# Patient Record
Sex: Female | Born: 1975 | Race: White | Hispanic: No | Marital: Single | Smoking: Never smoker
Health system: Southern US, Community
[De-identification: ages and names within clinical notes are randomized; demographics above are authoritative.]

## PROBLEM LIST (undated history)

## (undated) DIAGNOSIS — F419 Anxiety disorder, unspecified: Secondary | ICD-10-CM

## (undated) DIAGNOSIS — Z8781 Personal history of (healed) traumatic fracture: Secondary | ICD-10-CM

## (undated) DIAGNOSIS — S5291XA Unspecified fracture of right forearm, initial encounter for closed fracture: Secondary | ICD-10-CM

## (undated) DIAGNOSIS — F329 Major depressive disorder, single episode, unspecified: Secondary | ICD-10-CM

## (undated) DIAGNOSIS — S5292XA Unspecified fracture of left forearm, initial encounter for closed fracture: Secondary | ICD-10-CM

## (undated) DIAGNOSIS — F32A Depression, unspecified: Secondary | ICD-10-CM

## (undated) DIAGNOSIS — M419 Scoliosis, unspecified: Secondary | ICD-10-CM

## (undated) DIAGNOSIS — G8929 Other chronic pain: Secondary | ICD-10-CM

---

## 2011-07-07 ENCOUNTER — Emergency Department: Payer: Self-pay | Admitting: Internal Medicine

## 2011-07-07 LAB — DRUG SCREEN, URINE
Barbiturates, Ur Screen: NEGATIVE (ref ?–200)
Cannabinoid 50 Ng, Ur ~~LOC~~: POSITIVE (ref ?–50)
Opiate, Ur Screen: NEGATIVE (ref ?–300)
Phencyclidine (PCP) Ur S: NEGATIVE (ref ?–25)
Tricyclic, Ur Screen: NEGATIVE (ref ?–1000)

## 2011-07-07 LAB — TSH: Thyroid Stimulating Horm: 0.47 u[IU]/mL

## 2011-07-07 LAB — COMPREHENSIVE METABOLIC PANEL
Alkaline Phosphatase: 76 U/L (ref 50–136)
Anion Gap: 8 (ref 7–16)
BUN: 17 mg/dL (ref 7–18)
Chloride: 105 mmol/L (ref 98–107)
Co2: 24 mmol/L (ref 21–32)
Creatinine: 0.87 mg/dL (ref 0.60–1.30)
EGFR (African American): >60
EGFR (Non-African Amer.): >60
Osmolality: 275 (ref 275–301)
Potassium: 3.5 mmol/L (ref 3.5–5.1)
Total Protein: 7.4 g/dL (ref 6.4–8.2)

## 2011-07-07 LAB — CBC
MCH: 31.2 pg (ref 26.0–34.0)
MCHC: 33.8 g/dL (ref 32.0–36.0)
RDW: 12.8 % (ref 11.5–14.5)
WBC: 8.7 10*3/uL (ref 3.6–11.0)

## 2011-07-07 LAB — URINALYSIS, COMPLETE
Blood: NEGATIVE
Glucose,UR: NEGATIVE mg/dL (ref 0–75)
Leukocyte Esterase: NEGATIVE
Nitrite: NEGATIVE
Ph: 6 (ref 4.5–8.0)
Protein: 30
RBC,UR: 1 /HPF (ref 0–5)
Specific Gravity: 1.039 (ref 1.003–1.030)

## 2011-07-07 LAB — PREGNANCY, URINE: Pregnancy Test, Urine: NEGATIVE m[IU]/mL

## 2012-03-29 ENCOUNTER — Ambulatory Visit: Payer: Self-pay | Admitting: Family Medicine

## 2013-02-01 ENCOUNTER — Inpatient Hospital Stay: Payer: Self-pay | Admitting: Surgery

## 2013-02-01 LAB — COMPREHENSIVE METABOLIC PANEL
ALK PHOS: 69 U/L
Albumin: 3.8 g/dL (ref 3.4–5.0)
Anion Gap: 2 — ABNORMAL LOW (ref 7–16)
BUN: 11 mg/dL (ref 7–18)
Bilirubin,Total: 0.5 mg/dL (ref 0.2–1.0)
CALCIUM: 8.9 mg/dL (ref 8.5–10.1)
CO2: 29 mmol/L (ref 21–32)
Chloride: 104 mmol/L (ref 98–107)
Creatinine: 0.75 mg/dL (ref 0.60–1.30)
EGFR (African American): >60
EGFR (Non-African Amer.): >60
Glucose: 118 mg/dL — ABNORMAL HIGH (ref 65–99)
Osmolality: 271 (ref 275–301)
Potassium: 3.6 mmol/L (ref 3.5–5.1)
SGOT(AST): 30 U/L (ref 15–37)
SGPT (ALT): 37 U/L (ref 12–78)
Sodium: 135 mmol/L — ABNORMAL LOW (ref 136–145)
Total Protein: 7.4 g/dL (ref 6.4–8.2)

## 2013-02-01 LAB — CBC WITH DIFFERENTIAL/PLATELET
BASOS ABS: 0 10*3/uL (ref 0.0–0.1)
BASOS PCT: 0.4 %
EOS PCT: 0.4 %
Eosinophil #: 0.1 10*3/uL (ref 0.0–0.7)
HCT: 39.3 % (ref 35.0–47.0)
HGB: 13 g/dL (ref 12.0–16.0)
Lymphocyte #: 0.7 10*3/uL — ABNORMAL LOW (ref 1.0–3.6)
Lymphocyte %: 5.6 %
MCH: 30.8 pg (ref 26.0–34.0)
MCHC: 33 g/dL (ref 32.0–36.0)
MCV: 94 fL (ref 80–100)
Monocyte #: 0.4 x10 3/mm (ref 0.2–0.9)
Monocyte %: 2.8 %
Neutrophil #: 11.5 10*3/uL — ABNORMAL HIGH (ref 1.4–6.5)
Neutrophil %: 90.8 %
PLATELETS: 262 10*3/uL (ref 150–440)
RBC: 4.2 10*6/uL (ref 3.80–5.20)
RDW: 12.6 % (ref 11.5–14.5)
WBC: 12.7 10*3/uL — ABNORMAL HIGH (ref 3.6–11.0)

## 2013-02-01 LAB — URINALYSIS, COMPLETE
BILIRUBIN, UR: NEGATIVE
BLOOD: NEGATIVE
Bacteria: NONE SEEN
Glucose,UR: NEGATIVE mg/dL (ref 0–75)
LEUKOCYTE ESTERASE: NEGATIVE
Nitrite: NEGATIVE
Ph: 5 (ref 4.5–8.0)
Protein: NEGATIVE
SPECIFIC GRAVITY: 1.021 (ref 1.003–1.030)

## 2013-02-01 LAB — PREGNANCY, URINE: Pregnancy Test, Urine: NEGATIVE m[IU]/mL

## 2013-02-01 LAB — GC/CHLAMYDIA PROBE AMP

## 2013-02-01 LAB — WET PREP, GENITAL

## 2013-02-02 LAB — CBC WITH DIFFERENTIAL/PLATELET
BASOS PCT: 0.1 %
Basophil #: 0 10*3/uL (ref 0.0–0.1)
EOS ABS: 0 10*3/uL (ref 0.0–0.7)
Eosinophil %: 0 %
HCT: 32.4 % — ABNORMAL LOW (ref 35.0–47.0)
HGB: 11.1 g/dL — ABNORMAL LOW (ref 12.0–16.0)
Lymphocyte #: 0.3 10*3/uL — ABNORMAL LOW (ref 1.0–3.6)
Lymphocyte %: 3.3 %
MCH: 32.3 pg (ref 26.0–34.0)
MCHC: 34.2 g/dL (ref 32.0–36.0)
MCV: 94 fL (ref 80–100)
MONO ABS: 0.3 x10 3/mm (ref 0.2–0.9)
Monocyte %: 2.6 %
Neutrophil #: 9.9 10*3/uL — ABNORMAL HIGH (ref 1.4–6.5)
Neutrophil %: 94 %
Platelet: 207 10*3/uL (ref 150–440)
RBC: 3.43 10*6/uL — AB (ref 3.80–5.20)
RDW: 12.6 % (ref 11.5–14.5)
WBC: 10.5 10*3/uL (ref 3.6–11.0)

## 2013-02-05 LAB — PATHOLOGY REPORT
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
## 2014-05-03 NOTE — Op Note (Signed)
PATIENT NAME:  Alexis Deleon, Alexis Deleon MR#:  [id number] DATE OF BIRTH:  04-05-1975  DATE OF PROCEDURE:  02/01/2013  DATE OF DICTATION: 02/02/2013   PREOPERATIVE DIAGNOSIS: Acute appendicitis.   POSTOPERATIVE DIAGNOSIS: Acute perforated appendicitis.   PROCEDURE PERFORMED: Laparoscopic appendectomy.   SURGEON: Wanita Derenzo A. Egbert GaribaldiBird, MD  ASSISTANT: None.  ANESTHESIA: General endotracheal.   FINDINGS: Acute perforated appendicitis.   SPECIMENS: Appendix to pathology.   ESTIMATED BLOOD LOSS: Minimal.  DESCRIPTION OF PROCEDURE: With informed consent, supine position, general oral endotracheal anesthesia, the patient's abdomen was widely prepped and draped with ChloraPrep solution. Timeout was observed.   A 12 mm blunt Hasson trocar was placed through an open technique through an infraumbilical transversely oriented skin incision. Pneumoperitoneum was established. The 5 mm Bladeless trocar was placed in the right upper quadrant. The patient was then positioned in Trendelenburg and airplane right side up. There was purulence seen within the pelvis on the right side, a considerable amount, consistent with perforated appendicitis. The appendix was markedly dilated and thickened along with its mesoappendix.   An additional 5 mm Bladeless trocar was placed in the left lower quadrant. The appendix was identified, grasped towards it midportion and elevated towards the anterior abdominal wall. The mesoappendix was taken with the use of the Harmonic scalpel device. The confluence of the tenia was identified. A single fire of the endoscopic GIA stapler with blue load application was used to transect the base of the appendix. The specimen was captured in an Endo Catch device and retrieved through the infraumbilical port site. There were no signs of bowel injury upon trocar insertion.   Pneumoperitoneum was then re-established. The abdomen was copiously irrigated with 1 liter of normal saline and aspirated dry.  Hemostasis appeared to be adequate on the operative field. Ports were then removed under direct visualization.   The infraumbilical fascial defect was reapproximated with several interrupted vertically oriented #0 Vicryl sutures. A total of 30 mL of 0.25% plain Marcaine was infiltrated along all skin and fascial incisions prior to closure. Hemostasis was obtained with point cautery in the subcutaneous tissues, and skin edges were reapproximated utilizing interrupted 4-0 nylon sutures. Sterile dressings were then applied. The patient was subsequently extubated and taken to the recovery room in stable and satisfactory condition by anesthesia services.    ____________________________ Redge GainerMark A. Egbert GaribaldiBird, MD mab:lb D: 02/02/2013 08:40:00 ET T: 02/02/2013 13:22:36 ET JOB#: [id number]  cc: Loraine LericheMark A. Egbert GaribaldiBird, MD, <Dictator> Havyn Ramo Kela MillinA Vasilisa Vore MD ELECTRONICALLY SIGNED 02/05/2013 8:28

## 2018-04-07 MED ORDER — LORAZEPAM 2 MG/ML IJ SOLN
INTRAMUSCULAR | Status: AC
  Filled 2018-04-07: qty 1

## 2018-04-08 ENCOUNTER — Emergency Department: Payer: Self-pay

## 2018-04-08 ENCOUNTER — Inpatient Hospital Stay: Payer: Self-pay

## 2018-04-08 ENCOUNTER — Inpatient Hospital Stay
Admission: EM | Admit: 2018-04-08 | Discharge: 2018-04-09 | DRG: 101 | Disposition: A | Discharge status: Home / Self Care | Payer: Self-pay | Attending: Internal Medicine | Admitting: Internal Medicine

## 2018-04-08 ENCOUNTER — Other Ambulatory Visit: Payer: Self-pay

## 2018-04-08 DIAGNOSIS — W1830XA Fall on same level, unspecified, initial encounter: Secondary | ICD-10-CM | POA: Diagnosis present

## 2018-04-08 DIAGNOSIS — E872 Acidosis, unspecified: Secondary | ICD-10-CM

## 2018-04-08 DIAGNOSIS — F419 Anxiety disorder, unspecified: Secondary | ICD-10-CM | POA: Diagnosis present

## 2018-04-08 DIAGNOSIS — G40409 Other generalized epilepsy and epileptic syndromes, not intractable, without status epilepticus: Principal | ICD-10-CM | POA: Diagnosis present

## 2018-04-08 DIAGNOSIS — Y92009 Unspecified place in unspecified non-institutional (private) residence as the place of occurrence of the external cause: Secondary | ICD-10-CM

## 2018-04-08 DIAGNOSIS — F1210 Cannabis abuse, uncomplicated: Secondary | ICD-10-CM | POA: Diagnosis present

## 2018-04-08 DIAGNOSIS — M419 Scoliosis, unspecified: Secondary | ICD-10-CM | POA: Diagnosis present

## 2018-04-08 DIAGNOSIS — F329 Major depressive disorder, single episode, unspecified: Secondary | ICD-10-CM | POA: Diagnosis present

## 2018-04-08 DIAGNOSIS — R7989 Other specified abnormal findings of blood chemistry: Secondary | ICD-10-CM

## 2018-04-08 DIAGNOSIS — E876 Hypokalemia: Secondary | ICD-10-CM | POA: Diagnosis not present

## 2018-04-08 DIAGNOSIS — Z23 Encounter for immunization: Secondary | ICD-10-CM

## 2018-04-08 DIAGNOSIS — Z79899 Other long term (current) drug therapy: Secondary | ICD-10-CM

## 2018-04-08 DIAGNOSIS — R109 Unspecified abdominal pain: Secondary | ICD-10-CM

## 2018-04-08 DIAGNOSIS — R569 Unspecified convulsions: Secondary | ICD-10-CM

## 2018-04-08 DIAGNOSIS — G8929 Other chronic pain: Secondary | ICD-10-CM | POA: Diagnosis present

## 2018-04-08 DIAGNOSIS — S92211A Displaced fracture of cuboid bone of right foot, initial encounter for closed fracture: Secondary | ICD-10-CM | POA: Diagnosis present

## 2018-04-08 DIAGNOSIS — F909 Attention-deficit hyperactivity disorder, unspecified type: Secondary | ICD-10-CM | POA: Diagnosis present

## 2018-04-08 HISTORY — DX: Scoliosis, unspecified: M41.9

## 2018-04-08 HISTORY — DX: Unspecified fracture of right forearm, initial encounter for closed fracture: S52.91XA

## 2018-04-08 HISTORY — DX: Unspecified fracture of left forearm, initial encounter for closed fracture: S52.92XA

## 2018-04-08 HISTORY — DX: Anxiety disorder, unspecified: F41.9

## 2018-04-08 HISTORY — DX: Major depressive disorder, single episode, unspecified: F32.9

## 2018-04-08 HISTORY — DX: Other chronic pain: G89.29

## 2018-04-08 HISTORY — DX: Personal history of (healed) traumatic fracture: Z87.81

## 2018-04-08 HISTORY — DX: Depression, unspecified: F32.A

## 2018-04-08 LAB — LACTIC ACID, PLASMA
Lactic Acid, Venous: 1.2 mmol/L (ref 0.5–1.9)
Lactic Acid, Venous: 10.6 mmol/L (ref 0.5–1.9)

## 2018-04-08 LAB — COMPREHENSIVE METABOLIC PANEL
ALBUMIN: 4.4 g/dL (ref 3.5–5.0)
ALT: 19 U/L (ref 0–44)
ALT: 21 U/L (ref 0–44)
ANION GAP: 6 (ref 5–15)
AST: 28 U/L (ref 15–41)
AST: 43 U/L — ABNORMAL HIGH (ref 15–41)
Albumin: 3.8 g/dL (ref 3.5–5.0)
Alkaline Phosphatase: 49 U/L (ref 38–126)
Alkaline Phosphatase: 55 U/L (ref 38–126)
Anion gap: 18 — ABNORMAL HIGH (ref 5–15)
BUN: 7 mg/dL (ref 6–20)
BUN: 9 mg/dL (ref 6–20)
CHLORIDE: 110 mmol/L (ref 98–111)
CO2: 15 mmol/L — AB (ref 22–32)
CO2: 22 mmol/L (ref 22–32)
Calcium: 7.8 mg/dL — ABNORMAL LOW (ref 8.9–10.3)
Calcium: 8.7 mg/dL — ABNORMAL LOW (ref 8.9–10.3)
Chloride: 107 mmol/L (ref 98–111)
Creatinine, Ser: 0.64 mg/dL (ref 0.44–1.00)
Creatinine, Ser: 0.93 mg/dL (ref 0.44–1.00)
GFR calc Af Amer: >60 mL/min (ref >60)
GFR calc Af Amer: >60 mL/min (ref >60)
GFR calc non Af Amer: >60 mL/min (ref >60)
GFR calc non Af Amer: >60 mL/min (ref >60)
Glucose, Bld: 116 mg/dL — ABNORMAL HIGH (ref 70–99)
Glucose, Bld: 139 mg/dL — ABNORMAL HIGH (ref 70–99)
Potassium: 3.8 mmol/L (ref 3.5–5.1)
Potassium: 3.9 mmol/L (ref 3.5–5.1)
SODIUM: 140 mmol/L (ref 135–145)
Sodium: 138 mmol/L (ref 135–145)
Total Bilirubin: 0.5 mg/dL (ref 0.3–1.2)
Total Bilirubin: 0.5 mg/dL (ref 0.3–1.2)
Total Protein: 6.6 g/dL (ref 6.5–8.1)
Total Protein: 7.9 g/dL (ref 6.5–8.1)

## 2018-04-08 LAB — CBC
HCT: 36.3 % (ref 36.0–46.0)
Hemoglobin: 12.1 g/dL (ref 12.0–15.0)
MCH: 29.7 pg (ref 26.0–34.0)
MCHC: 33.3 g/dL (ref 30.0–36.0)
MCV: 89.2 fL (ref 80.0–100.0)
Platelets: 246 10*3/uL (ref 150–400)
RBC: 4.07 MIL/uL (ref 3.87–5.11)
RDW: 12.3 % (ref 11.5–15.5)
WBC: 9.7 10*3/uL (ref 4.0–10.5)
nRBC: 0 % (ref 0.0–0.2)

## 2018-04-08 LAB — URINALYSIS, COMPLETE (UACMP) WITH MICROSCOPIC
Bilirubin Urine: NEGATIVE
Glucose, UA: NEGATIVE mg/dL
HGB URINE DIPSTICK: NEGATIVE
Ketones, ur: NEGATIVE mg/dL
Leukocytes,Ua: NEGATIVE
Nitrite: NEGATIVE
PROTEIN: 30 mg/dL — AB
Specific Gravity, Urine: 1.015 (ref 1.005–1.030)
pH: 5 (ref 5.0–8.0)

## 2018-04-08 LAB — URINE DRUG SCREEN, QUALITATIVE (ARMC ONLY)
Amphetamines, Ur Screen: POSITIVE — AB
Barbiturates, Ur Screen: NOT DETECTED
Benzodiazepine, Ur Scrn: POSITIVE — AB
Cannabinoid 50 Ng, Ur ~~LOC~~: POSITIVE — AB
Cocaine Metabolite,Ur ~~LOC~~: NOT DETECTED
MDMA (Ecstasy)Ur Screen: NOT DETECTED
Methadone Scn, Ur: NOT DETECTED
Opiate, Ur Screen: NOT DETECTED
Phencyclidine (PCP) Ur S: NOT DETECTED
TRICYCLIC, UR SCREEN: NOT DETECTED

## 2018-04-08 LAB — CBC WITH DIFFERENTIAL/PLATELET
Abs Immature Granulocytes: 0.02 10*3/uL (ref 0.00–0.07)
BASOS ABS: 0 10*3/uL (ref 0.0–0.1)
Basophils Relative: 0 %
Eosinophils Absolute: 0 10*3/uL (ref 0.0–0.5)
Eosinophils Relative: 0 %
HCT: 41.6 % (ref 36.0–46.0)
Hemoglobin: 13.7 g/dL (ref 12.0–15.0)
Immature Granulocytes: 0 %
LYMPHS ABS: 1.1 10*3/uL (ref 0.7–4.0)
LYMPHS PCT: 14 %
MCH: 30.2 pg (ref 26.0–34.0)
MCHC: 32.9 g/dL (ref 30.0–36.0)
MCV: 91.8 fL (ref 80.0–100.0)
Monocytes Absolute: 0.3 10*3/uL (ref 0.1–1.0)
Monocytes Relative: 4 %
NRBC: 0 % (ref 0.0–0.2)
Neutro Abs: 6 10*3/uL (ref 1.7–7.7)
Neutrophils Relative %: 82 %
Platelets: 322 10*3/uL (ref 150–400)
RBC: 4.53 MIL/uL (ref 3.87–5.11)
RDW: 12.2 % (ref 11.5–15.5)
WBC: 7.4 10*3/uL (ref 4.0–10.5)

## 2018-04-08 LAB — ETHANOL: Alcohol, Ethyl (B): <10 mg/dL (ref <10)

## 2018-04-08 LAB — MAGNESIUM: Magnesium: 2.2 mg/dL (ref 1.7–2.4)

## 2018-04-08 LAB — PROTIME-INR
INR: 1 (ref 0.8–1.2)
Prothrombin Time: 12.9 seconds (ref 11.4–15.2)

## 2018-04-08 LAB — LIPASE, BLOOD
Lipase: 22 U/L (ref 11–51)
Lipase: 24 U/L (ref 11–51)

## 2018-04-08 LAB — POCT PREGNANCY, URINE: Preg Test, Ur: NEGATIVE

## 2018-04-08 LAB — PHOSPHORUS: PHOSPHORUS: 2 mg/dL — AB (ref 2.5–4.6)

## 2018-04-08 LAB — TROPONIN I: Troponin I: <0.03 ng/mL (ref <0.03)

## 2018-04-08 LAB — GLUCOSE, CAPILLARY: GLUCOSE-CAPILLARY: 123 mg/dL — AB (ref 70–99)

## 2018-04-08 LAB — ACETAMINOPHEN LEVEL

## 2018-04-08 LAB — SALICYLATE LEVEL: Salicylate Lvl: <7 mg/dL (ref 2.8–30.0)

## 2018-04-08 LAB — BRAIN NATRIURETIC PEPTIDE: B Natriuretic Peptide: 95 pg/mL (ref 0.0–100.0)

## 2018-04-08 MED ORDER — SODIUM CHLORIDE 0.9 % IV BOLUS
1000.0000 mL | Freq: Once | INTRAVENOUS | Status: AC
  Administered 2018-04-08: 1000 mL via INTRAVENOUS

## 2018-04-08 MED ORDER — OXYCODONE-ACETAMINOPHEN 5-325 MG PO TABS
1.0000 | ORAL_TABLET | ORAL | Status: DC | PRN
  Administered 2018-04-08: 1 via ORAL
  Filled 2018-04-08: qty 1

## 2018-04-08 MED ORDER — ENOXAPARIN SODIUM 40 MG/0.4ML ~~LOC~~ SOLN
40.0000 mg | SUBCUTANEOUS | Status: DC
  Administered 2018-04-09: 09:00:00 40 mg via SUBCUTANEOUS
  Filled 2018-04-08: qty 0.4

## 2018-04-08 MED ORDER — ENOXAPARIN SODIUM 40 MG/0.4ML ~~LOC~~ SOLN
40.0000 mg | SUBCUTANEOUS | Status: DC
  Administered 2018-04-08: 40 mg via SUBCUTANEOUS
  Filled 2018-04-08: qty 0.4

## 2018-04-08 MED ORDER — LACOSAMIDE 50 MG PO TABS
100.0000 mg | ORAL_TABLET | Freq: Two times a day (BID) | ORAL | Status: DC
  Administered 2018-04-09: 09:00:00 100 mg via ORAL
  Filled 2018-04-08: qty 2

## 2018-04-08 MED ORDER — ONDANSETRON HCL 4 MG/2ML IJ SOLN
4.0000 mg | Freq: Four times a day (QID) | INTRAMUSCULAR | Status: DC | PRN
  Administered 2018-04-08: 4 mg via INTRAVENOUS
  Filled 2018-04-08: qty 2

## 2018-04-08 MED ORDER — SODIUM CHLORIDE 0.9 % IV SOLN
200.0000 mg | Freq: Once | INTRAVENOUS | Status: AC
  Administered 2018-04-09: 200 mg via INTRAVENOUS
  Filled 2018-04-08: qty 20

## 2018-04-08 MED ORDER — LORAZEPAM 2 MG/ML IJ SOLN: 1.0000 mg | INTRAMUSCULAR | Status: DC | PRN

## 2018-04-08 MED ORDER — LEVETIRACETAM IN NACL 1000 MG/100ML IV SOLN
1000.0000 mg | INTRAVENOUS | Status: AC
  Administered 2018-04-08: 1000 mg via INTRAVENOUS
  Filled 2018-04-08: qty 100

## 2018-04-08 MED ORDER — ACETAMINOPHEN 325 MG PO TABS
650.0000 mg | ORAL_TABLET | ORAL | Status: DC | PRN
  Administered 2018-04-08 (×2): 650 mg via ORAL
  Filled 2018-04-08 (×2): qty 2

## 2018-04-08 MED ORDER — LEVETIRACETAM 500 MG PO TABS
500.0000 mg | ORAL_TABLET | Freq: Two times a day (BID) | ORAL | Status: DC
  Administered 2018-04-08: 09:00:00 500 mg via ORAL
  Filled 2018-04-08 (×2): qty 1

## 2018-04-08 MED ORDER — GADOBUTROL 1 MMOL/ML IV SOLN
10.0000 mL | Freq: Once | INTRAVENOUS | Status: AC | PRN
  Administered 2018-04-08: 10 mL via INTRAVENOUS

## 2018-04-08 MED ORDER — INFLUENZA VAC SPLIT QUAD 0.5 ML IM SUSY
0.5000 mL | PREFILLED_SYRINGE | INTRAMUSCULAR | Status: AC
  Administered 2018-04-09: 14:00:00 0.5 mL via INTRAMUSCULAR
  Filled 2018-04-08: qty 0.5

## 2018-04-08 MED ORDER — ACETAMINOPHEN 650 MG RE SUPP: 650.0000 mg | RECTAL | Status: DC | PRN

## 2018-04-08 MED ORDER — SODIUM CHLORIDE 0.9 % IV SOLN
75.0000 mL/h | INTRAVENOUS | Status: DC
  Administered 2018-04-08: 75 mL/h via INTRAVENOUS

## 2018-04-08 MED ORDER — MAGNESIUM HYDROXIDE 400 MG/5ML PO SUSP
30.0000 mL | Freq: Every day | ORAL | Status: DC | PRN
  Filled 2018-04-08: qty 30

## 2018-04-08 MED ORDER — ALPRAZOLAM 1 MG PO TABS: 1.0000 mg | ORAL_TABLET | Freq: Two times a day (BID) | ORAL | Status: DC | PRN

## 2018-04-08 MED ORDER — LORAZEPAM 2 MG/ML IJ SOLN: 1.0000 mg | Freq: Once | INTRAMUSCULAR | Status: DC

## 2018-04-08 MED ORDER — KETOROLAC TROMETHAMINE 30 MG/ML IJ SOLN
30.0000 mg | Freq: Once | INTRAMUSCULAR | Status: AC
  Administered 2018-04-08: 30 mg via INTRAVENOUS
  Filled 2018-04-08: qty 1

## 2018-04-08 MED ORDER — ONDANSETRON HCL 4 MG/2ML IJ SOLN
4.0000 mg | INTRAMUSCULAR | Status: AC
  Administered 2018-04-08: 4 mg via INTRAVENOUS
  Filled 2018-04-08: qty 2

## 2018-04-08 MED ORDER — ONDANSETRON HCL 4 MG PO TABS: 4.0000 mg | ORAL_TABLET | Freq: Four times a day (QID) | ORAL | Status: DC | PRN

## 2018-04-08 NOTE — Progress Notes (Signed)
PT Cancellation Note  Patient Details Name: Alexis Deleon MRN: [id number] DOB: August 10, 1975   Cancelled Treatment:    Reason Eval/Treat Not Completed: Patient's level of consciousness(Patient returned from MRI, finally asleep after being very disoriented. ) Patient just returned back to room after MRI and per nursing staff is finally asleep after being very disoriented/agitated. Requests PT come back at a later time. Will attempt again at a later time.   Precious Bard, PT, DPT   04/08/2018, 3:44 PM

## 2018-04-08 NOTE — Consult Note (Signed)
Reason for Consult:Seizure Referring Physician: Mody  CC: Seizure  HPI: Alexis Deleon is an 43 y.o. female who is unable to provide an accurate history this morning.  Family not available therefore al history obtained from the chart.   According to the paramedics, the boyfriend reported that he heard some noises in the house but assumed she was just moving around.  However she came into his area of the house yesterday evening and collapsed in front of him and began shaking all over with seizure-like activity.  She was unresponsive and frothing at the mouth.  Paramedics were called and she was awake and alert but sleepy when they got there, but then she had another episode as described above.  She stopped seizing by the time they got her to the ambulance and was postictal but was alert by the time they got to the hospital and answering questions, denying any symptoms.  Almost immediately after arrival in the emergency department she had another generalized tonic-clonic seizure witnessed by ED staff.  It is unclear if the patient has ever had a seizure in the past.  Boyfriend could not confirm whether the patient was taking Suboxone and/or using illicit substances.    Past Medical History:  Diagnosis Date  . Anxiety   . Bilateral forearm fractures   . Chronic pain   . Depression   . History of pelvic fracture   . Scoliosis     History reviewed. No pertinent surgical history.  History reviewed. No pertinent family history.  Social History:  reports that she has never smoked. She has never used smokeless tobacco. She reports current alcohol use. She reports that she does not use drugs.  Not on File  Medications:  I have reviewed the patient's current medications. Prior to Admission:  Medications Prior to Admission  Medication Sig Dispense Refill Last Dose  . ALPRAZolam (XANAX) 1 MG tablet Take 1 tablet by mouth 2 (two) times daily as needed.   unknown at unknown  .  amphetamine-dextroamphetamine (ADDERALL) 30 MG tablet Take 1 tablet by mouth 2 (two) times daily as needed.   unknown at unknown  . HYDROcodone-acetaminophen (NORCO/VICODIN) 5-325 MG tablet Take 1 tablet by mouth 2 (two) times daily.   unknown at unknown  . phentermine (ADIPEX-P) 37.5 MG tablet Take 1 tablet by mouth every morning.   unknown at unknown   Scheduled: . [START ON 04/09/2018] enoxaparin (LOVENOX) injection  40 mg Subcutaneous Q24H  . [START ON 04/09/2018] Influenza vac split quadrivalent PF  0.5 mL Intramuscular Tomorrow-1000  . levETIRAcetam  500 mg Oral BID  . LORazepam      . LORazepam  1 mg Intravenous Once    ROS: Unable to provide due to mental status  Physical Examination: Blood pressure 128/72, pulse 87, temperature (!) 97.5 F (36.4 C), temperature source Oral, resp. rate (!) 22, height 5\' 7"  (1.702 m), weight 101.5 kg, SpO2 100 %.  HEENT-  Normocephalic, no lesions, without obvious abnormality.  Normal external eye and conjunctiva.  Normal TM's bilaterally.  Normal auditory canals and external ears. Normal external nose, mucus membranes and septum.  Normal pharynx. Cardiovascular- S1, S2 normal, pulses palpable throughout   Lungs- chest clear, no wheezing, rales, normal symmetric air entry Extremities- no edema Lymph-no adenopathy palpable Musculoskeletal-ankle pain Skin-warm and dry, no hyperpigmentation, vitiligo, or suspicious lesions  Neurological Examination   Mental Status: Alert but complaining of pain. Able to follow simple commands but does not attend for long periods of time.  Unable to provide reliable history.   Cranial Nerves: II: Blinks to bilateral confrontation III,IV, VI: Extra-ocular motions grossly intact bilaterally V,VII: smile symmetric, facial light touch sensation normal bilaterally VIII: hearing normal bilaterally IX,X: gag reflex present XI: bilateral shoulder shrug XII: midline tongue extension Motor: Moves all extremities against  gravity with no focal weakness appreciated Sensory: Pinprick and light touch intact throughout, bilaterally Deep Tendon Reflexes: Symmetric throughout Plantars: Right: unable to test due to bandaging   Left: downgoing Cerebellar: Unable to attend long enough to perform Gait: not tested due to safety concerns  Laboratory Studies:   Basic Metabolic Panel: Recent Labs  Lab 04/08/18 0012 04/08/18 0455  NA 140 138  K 3.8 3.9  CL 107 110  CO2 15* 22  GLUCOSE 139* 116*  BUN 9 7  CREATININE 0.93 0.64  CALCIUM 8.7* 7.8*    Liver Function Tests: Recent Labs  Lab 04/08/18 0012 04/08/18 0455  AST 43* 28  ALT 21 19  ALKPHOS 55 49  BILITOT 0.5 0.5  PROT 7.9 6.6  ALBUMIN 4.4 3.8   Recent Labs  Lab 04/08/18 0012  LIPASE 24   No results for input(s): AMMONIA in the last 168 hours.  CBC: Recent Labs  Lab 04/08/18 0012 04/08/18 0455  WBC 7.4 9.7  NEUTROABS 6.0  --   HGB 13.7 12.1  HCT 41.6 36.3  MCV 91.8 89.2  PLT 322 246    Cardiac Enzymes: Recent Labs  Lab 04/08/18 0012  TROPONINI <0.03    BNP: Invalid input(s): POCBNP  CBG: Recent Labs  Lab 04/08/18 0025  GLUCAP 123*    Microbiology: No results found for this or any previous visit.  Coagulation Studies: Recent Labs    04/08/18 0012  LABPROT 12.9  INR 1.0    Urinalysis:  Recent Labs  Lab 04/08/18 0012  COLORURINE YELLOW*  LABSPEC 1.015  PHURINE 5.0  GLUCOSEU NEGATIVE  HGBUR NEGATIVE  BILIRUBINUR NEGATIVE  KETONESUR NEGATIVE  PROTEINUR 30*  NITRITE NEGATIVE  LEUKOCYTESUR NEGATIVE    Lipid Panel:  No results found for: CHOL, TRIG, HDL, CHOLHDL, VLDL, LDLCALC  HgbA1C: No results found for: HGBA1C  Urine Drug Screen:      Component Value Date/Time   LABOPIA NONE DETECTED 04/08/2018 0012   COCAINSCRNUR NONE DETECTED 04/08/2018 0012   LABBENZ POSITIVE (A) 04/08/2018 0012   AMPHETMU POSITIVE (A) 04/08/2018 0012   THCU POSITIVE (A) 04/08/2018 0012   LABBARB NONE DETECTED  04/08/2018 0012    Alcohol Level:  Recent Labs  Lab 04/08/18 0201  ETH <10    Other results: EKG: sinus tachycardia at 117 bpm.  Imaging: Dg Chest 1 View  Result Date: 04/08/2018 CLINICAL DATA:  Seizure. Pain after fall. EXAM: CHEST  1 VIEW COMPARISON:  None. FINDINGS: The cardiomediastinal contours are normal. The lungs are clear. Pulmonary vasculature is normal. No consolidation, pleural effusion, or pneumothorax. Plate and screw fixation of left clavicle. Well-circumscribed rounded lucency in the right proximal humerus is likely a postsurgical defect. No acute osseous abnormalities are seen. IMPRESSION: No acute chest findings. Electronically Signed   By: Narda Rutherford M.D.   On: 04/08/2018 00:55   Dg Ankle Complete Right  Result Date: 04/08/2018 CLINICAL DATA:  Pain after fall, seizure. EXAM: RIGHT ANKLE - COMPLETE 3+ VIEW COMPARISON:  None. FINDINGS: Possible midfoot fracture not well evaluated on this ankle exam. No additional fracture of the ankle. The ankle mortise is preserved. Well-defined curvilinear ossification adjacent to the medial malleolus suggest remote prior injury.  Plantar calcaneal spur. There is lateral soft tissue edema. IMPRESSION: Lateral soft tissue edema. Possible midfoot fracture not well evaluated on this ankle exam. Recommend dedicated radiographs of the foot. No other fracture of the ankle. Electronically Signed   By: Narda Rutherford M.D.   On: 04/08/2018 00:53   Ct Head Wo Contrast  Result Date: 04/08/2018 CLINICAL DATA:  Initial evaluation for acute seizure. EXAM: CT HEAD WITHOUT CONTRAST TECHNIQUE: Contiguous axial images were obtained from the base of the skull through the vertex without intravenous contrast. COMPARISON:  None available. FINDINGS: Brain: Cerebral volume within normal limits for patient age. No evidence for acute intracranial hemorrhage. No findings to suggest acute large vessel territory infarct. No mass lesion, midline shift, or mass  effect. Ventricles are normal in size without evidence for hydrocephalus. No extra-axial fluid collection identified. Vascular: No hyperdense vessel identified. Skull: Scalp soft tissues demonstrate no acute abnormality. Calvarium intact. Sinuses/Orbits: Globes and orbital soft tissues within normal limits. Visualized paranasal sinuses are clear. No mastoid effusion. IMPRESSION: Normal head CT.  No acute intracranial abnormality. Electronically Signed   By: Rise Mu M.D.   On: 04/08/2018 00:52   Dg Foot Complete Right  Result Date: 04/08/2018 CLINICAL DATA:  Right foot and ankle pain after seizure, fall. Question midfoot fracture on radiograph. EXAM: RIGHT FOOT COMPLETE - 3+ VIEW COMPARISON:  Ankle radiograph earlier this day. FINDINGS: Nondisplaced mildly comminuted cuboid fracture. Fracture extends to the tarsal metatarsal joint. Adjacent fragmented os peroneal, incidental. No additional fracture of the foot. There is lateral soft tissue edema. IMPRESSION: Nondisplaced mildly comminuted cuboid fracture extending to the tarsal metatarsal joint. Electronically Signed   By: Narda Rutherford M.D.   On: 04/08/2018 01:15     Assessment/Plan: 43 year old female with a history of chronic pain and ADHD presenting with what appears to be new onset seizures.   UDS done on admission significant for THC and medications prescribed.  Head CT reviewed and shows no acute changes.  Unable to get specifics on medications, such as abrupt discontinuation of her Xanax, etc.  Patient does not cooperate fully for examination today but no gross focality noted.  Patient has been started on Keppra.  Unclear if seizures provoked.  Mental status this morning may be related to pain and/or withdrawal but can not rule out side effects to Keppra as well.    Recommendations: 1.  MRI of the brain with and without contrast once patient less agitated 2.  EEG 3.  Seizure precautions 4.  Ativan prn seizure activity 5.   Discontinue Keppra 6.  Agree with discontinuation of all home medications other than Xanax.  Would continue Xanax on home schedule.  7.  Vimpat  IV for evening dose with start of maintenance tomorrow at  BID  8.  Serum magnesium and phosphorus   Thana Farr, MD Neurology [phone] 04/08/2018, 11:05 AM

## 2018-04-08 NOTE — Progress Notes (Signed)
[facility] [place] at [facility]   PATIENT NAME: Alexis Deleon    MR#:  [id number]  DATE OF BIRTH:  May 29, 1975  SUBJECTIVE:   patientc/o abdominal pain   REVIEW OF SYSTEMS:    Review of Systems  Constitutional: Negative for fever, chills weight loss HENT: Negative for ear pain, nosebleeds, congestion, facial swelling, rhinorrhea, neck pain, neck stiffness and ear discharge.   Respiratory: Negative for cough, shortness of breath, wheezing  Cardiovascular: Negative for chest pain, palpitations and leg swelling.  Gastrointestinal: Negative for heartburn,++ abdominal pain, no vomiting, diarrhea or consitpation Genitourinary: Negative for dysuria, urgency, frequency, hematuria Musculoskeletal: Negative for back pain or joint pain Neurological: Negative for dizziness, seizures, syncope, focal weakness,  numbness and headaches.  Hematological: Does not bruise/bleed easily.  Psychiatric/Behavioral: Negative for hallucinations, confusion, dysphoric mood    Tolerating Diet: no      DRUG ALLERGIES:  Not on File  VITALS:  Blood pressure 128/72, pulse 87, temperature (!) 97.5 F (36.4 C), temperature source Oral, resp. rate (!) 22, height 5\' 7"  (1.702 m), weight 101.5 kg, SpO2 100 %.  PHYSICAL EXAMINATION:  Constitutional: Appears well-developed and well-nourished. No distress. HENT: Normocephalic. Marland Kitchen Oropharynx is clear and moist.  Eyes: Conjunctivae and EOM are normal. PERRLA, no scleral icterus.  Neck: Normal ROM. Neck supple. No JVD. No tracheal deviation. CVS: RRR, S1/S2 +, no murmurs, no gallops, no carotid bruit.  Pulmonary: Effort and breath sounds normal, no stridor, rhonchi, wheezes, rales.  Abdominal: Soft. BS +,  no distension, tenderness, rebound or guarding.  Musculoskeletal: Normal range of motion. No edema and no tenderness.  Neuro: Alert. CN 2-12 grossly intact. No focal deficits. Skin: Skin is warm and dry. No rash noted. Psychiatric: Normal  mood and affect.      LABORATORY PANEL:   CBC Recent Labs  Lab 04/08/18 0455  WBC 9.7  HGB 12.1  HCT 36.3  PLT 246   ------------------------------------------------------------------------------------------------------------------  Chemistries  Recent Labs  Lab 04/08/18 0455  NA 138  K 3.9  CL 110  CO2 22  GLUCOSE 116*  BUN 7  CREATININE 0.64  CALCIUM 7.8*  AST 28  ALT 19  ALKPHOS 49  BILITOT 0.5   ------------------------------------------------------------------------------------------------------------------  Cardiac Enzymes Recent Labs  Lab 04/08/18 0012  TROPONINI <0.03   ------------------------------------------------------------------------------------------------------------------  RADIOLOGY:  Dg Chest 1 View  Result Date: 04/08/2018 CLINICAL DATA:  Seizure. Pain after fall. EXAM: CHEST  1 VIEW COMPARISON:  None. FINDINGS: The cardiomediastinal contours are normal. The lungs are clear. Pulmonary vasculature is normal. No consolidation, pleural effusion, or pneumothorax. Plate and screw fixation of left clavicle. Well-circumscribed rounded lucency in the right proximal humerus is likely a postsurgical defect. No acute osseous abnormalities are seen. IMPRESSION: No acute chest findings. Electronically Signed   By: Narda Rutherford M.D.   On: 04/08/2018 00:55   Dg Ankle Complete Right  Result Date: 04/08/2018 CLINICAL DATA:  Pain after fall, seizure. EXAM: RIGHT ANKLE - COMPLETE 3+ VIEW COMPARISON:  None. FINDINGS: Possible midfoot fracture not well evaluated on this ankle exam. No additional fracture of the ankle. The ankle mortise is preserved. Well-defined curvilinear ossification adjacent to the medial malleolus suggest remote prior injury. Plantar calcaneal spur. There is lateral soft tissue edema. IMPRESSION: Lateral soft tissue edema. Possible midfoot fracture not well evaluated on this ankle exam. Recommend dedicated radiographs of the foot. No other  fracture of the ankle. Electronically Signed   By: Narda Rutherford M.D.   On: 04/08/2018 00:53  Ct Head Wo Contrast  Result Date: 04/08/2018 CLINICAL DATA:  Initial evaluation for acute seizure. EXAM: CT HEAD WITHOUT CONTRAST TECHNIQUE: Contiguous axial images were obtained from the base of the skull through the vertex without intravenous contrast. COMPARISON:  None available. FINDINGS: Brain: Cerebral volume within normal limits for patient age. No evidence for acute intracranial hemorrhage. No findings to suggest acute large vessel territory infarct. No mass lesion, midline shift, or mass effect. Ventricles are normal in size without evidence for hydrocephalus. No extra-axial fluid collection identified. Vascular: No hyperdense vessel identified. Skull: Scalp soft tissues demonstrate no acute abnormality. Calvarium intact. Sinuses/Orbits: Globes and orbital soft tissues within normal limits. Visualized paranasal sinuses are clear. No mastoid effusion. IMPRESSION: Normal head CT.  No acute intracranial abnormality. Electronically Signed   By: Rise Mu M.D.   On: 04/08/2018 00:52   Dg Foot Complete Right  Result Date: 04/08/2018 CLINICAL DATA:  Right foot and ankle pain after seizure, fall. Question midfoot fracture on radiograph. EXAM: RIGHT FOOT COMPLETE - 3+ VIEW COMPARISON:  Ankle radiograph earlier this day. FINDINGS: Nondisplaced mildly comminuted cuboid fracture. Fracture extends to the tarsal metatarsal joint. Adjacent fragmented os peroneal, incidental. No additional fracture of the foot. There is lateral soft tissue edema. IMPRESSION: Nondisplaced mildly comminuted cuboid fracture extending to the tarsal metatarsal joint. Electronically Signed   By: Narda Rutherford M.D.   On: 04/08/2018 01:15     ASSESSMENT AND PLAN:   43 year old female with a history of ADHD and marijuana abuse who presented to the emergency room with witnessed seizure.  1.  New onset seizure: Patient will  need an MRI and EEG. Follow-up recommendations by Dr. Thad Ranger. Continue seizure precautions.  2.  Abdominal pain without abdominal tenderness on physical examination: KUB Lipase  3.comminuted fracture to the cuboid bone: Patient evaluated by Dr. Orland Jarred this morning. Continue not weightbearing status. Physical therapy consultation to work with her to utilize crutches Follow-up with podiatry in 1 week, she may need a cast at that point.  4.  ADD: Hold medications for now  5.  Marijuana abuse: Patient counseled regarding this.   Management plans discussed with the patient and she is in agreement.  CODE STATUS: full  TOTAL TIME TAKING CARE OF THIS PATIENT: 33 minutes.     POSSIBLE D/C tomorrow, DEPENDING ON CLINICAL CONDITION.   Adrian Saran M.D on 04/08/2018 at 10:35 AM  Between 7am to 6pm - Pager - [phone] After 6pm go to [email or web address] - password [email or web address]  [facility]  Office  [phone]  CC: Primary care physician; Patient, No Pcp Per  Note: This dictation was prepared with Dragon dictation along with smaller phrase technology. Any transcriptional errors that result from this process are unintentional.

## 2018-04-08 NOTE — Progress Notes (Signed)
ANTI-EPILEPTIC CONSULT NOTE - INITIAL   Pharmacy Consult for monitoring for drug-drug interactions for anti-epileptic therapy Indication: possible grand-mal seizure  Not on File  Patient Measurements: Height: 5\' 8"  (172.7 cm) Weight: 190 lb (86.2 kg) IBW/kg (Calculated) : 63.9 Adjusted Body Weight: 73 kg  Vital Signs: Temp: 97.7 F (36.5 C) (03/29 0011) Temp Source: Oral (03/29 0011) BP: 126/71 (03/29 0130) Pulse Rate: 88 (03/29 0130) Intake/Output from previous day: 03/28 0701 - 03/29 0700 In: 92.2 [IV Piggyback:92.2] Out: -  Intake/Output from this shift: Total I/O In: 92.2 [IV Piggyback:92.2] Out: -   Labs: Recent Labs    04/08/18 0012  WBC 7.4  HGB 13.7  HCT 41.6  PLT 322  CREATININE 0.93  ALBUMIN 4.4  PROT 7.9  AST 43*  ALT 21  ALKPHOS 55  BILITOT 0.5   Estimated Creatinine Clearance: 89.6 mL/min (by C-G formula based on SCr of 0.93 mg/dL).   Microbiology: No results found for this or any previous visit (from the past 720 hour(s)).  Medical History: Past Medical History:  Diagnosis Date  . Anxiety   . Bilateral forearm fractures   . Chronic pain   . Depression   . History of pelvic fracture   . Scoliosis     Medications:  Scheduled:  . enoxaparin (LOVENOX) injection  40 mg Subcutaneous Q24H  . levETIRAcetam  500 mg Oral BID  . LORazepam  1 mg Intravenous Once    Assessment: Patient arrives to ED via ems after being found down seizing and foaming from the mouth. Patient apparently does not have a h/o of seizures, and received ativan and keppra for seizure control. On PTA medications patient is noted to be taking phentermine (a stimulant typically used for weight loss as well as adderall (amphetamine-dextroamphetamine salts) which is also another stimulant).  Goal of Therapy:  Cessation of seizure and prevention of adverse events   Plan:  At this time there are no significant drug-drug interactions with the inpatient medications the  patient has been ordered, with exception of added sedation of ativan, keppra, and xanax. Recommend to discontinue phentermine and/or adderall or potentially switch to non-stimulant based medications. Will continue to monitor for drug-drug interactions and s/sx of seizures.  Thomasene Ripple, PharmD, BCPS Clinical Pharmacist 04/08/2018

## 2018-04-08 NOTE — ED Notes (Signed)
pts family member attempted to be updated at this time

## 2018-04-08 NOTE — ED Provider Notes (Signed)
[facility] Emergency Department Provider Note  ____________________________________________   First MD Initiated Contact with Patient 04/08/18 0011     (approximate)  I have reviewed the triage vital signs and the nursing notes.   HISTORY  Chief Complaint Seizures  Level 5 caveat:  history/ROS limited by acute/critical illness  HPI Alexis Deleon is a 43 y.o. female with medical history as listed below who may or may not be taking Suboxone and/or using illicit substances according to her boyfriend.  She comes in by EMS for seizure activity.  According to the paramedics, the boyfriend reports that they have mostly been apart today in 7 parts of the house.  He has heard some noises but assumed she was just moving around.  However she came into his area of the house this evening and collapsed in front of him and began shaking all over with seizure-like activity.  She was unresponsive and frothing at the mouth.  Paramedics were called and she was awake and alert but sleepy when they got there, but then she had another episode as described above.  She stopped seizing by the time they got her to the ambulance and was postictal but was alert by the time they got to the hospital and answering questions, denying any symptoms.  Almost immediately after arrival in the emergency department she had another generalized tonic-clonic seizure witnessed by ED staff.  I was called to the room immediately but by that time she had stopped seizing but was clearly postictal.  Within a couple of minutes, she was still altered but was able to answer simple questions and denying any pain except for in her right ankle and she denied shortness of breath.  She denies having any injuries today and does not remember why her ankle hurts.  She denies drug use.  She says she has never had seizures before.  She is still clearly altered and requires constant redirection where she falls back asleep.  See  hospital course for additional details.         Past Medical History:  Diagnosis Date   Anxiety    Bilateral forearm fractures    Chronic pain    Depression    History of pelvic fracture    Scoliosis     Patient Active Problem List   Diagnosis Date Noted   Seizure (HCC) 04/08/2018    History reviewed. No pertinent surgical history.  Prior to Admission medications   Medication Sig Start Date End Date Taking? Authorizing Provider  ALPRAZolam Prudy Feeler) 1 MG tablet Take 1 tablet by mouth 2 (two) times daily as needed. 04/03/18  Yes [provider]  amphetamine-dextroamphetamine (ADDERALL) 30 MG tablet Take 1 tablet by mouth 2 (two) times daily as needed. 03/12/18 03/12/19 Yes [provider]  HYDROcodone-acetaminophen (NORCO/VICODIN) 5-325 MG tablet Take 1 tablet by mouth 2 (two) times daily. 03/13/18  Yes [provider]  phentermine (ADIPEX-P) 37.5 MG tablet Take 1 tablet by mouth every morning. 04/03/18  Yes [provider]    Allergies Patient has no allergy information on record.  History reviewed. No pertinent family history.  Social History Social History   Tobacco Use   Smoking status: Never Smoker   Smokeless tobacco: Never Used  Substance Use Topics   Alcohol use: Yes   Drug use: Never    Review of Systems Level 5 caveat:  history/ROS limited by acute/critical illness  Constitutional: No fever/chills ENT: No sore throat. Cardiovascular: Denies chest pain. Respiratory:  Denies shortness of breath. Gastrointestinal: No abdominal pain.  No nausea, no vomiting.  No diarrhea.  No constipation. Genitourinary: Negative for dysuria. Musculoskeletal: Pain in right ankle.  Negative for neck pain.  Negative for back pain. Integumentary: Negative for rash. Neurological: At least 3 generalized tonic-clonic seizure episodes with postictal state.   ____________________________________________   PHYSICAL EXAM:  VITAL  SIGNS: ED Triage Vitals  Enc Vitals Group     BP --      Pulse Rate 04/08/18 0007 (!) 119     Resp 04/08/18 0007 (!) 21     Temp 04/08/18 0011 97.7 F (36.5 C)     Temp Source 04/08/18 0011 Oral     SpO2 04/08/18 0007 99 %     Weight 04/08/18 0004 86.2 kg (190 lb)     Height 04/08/18 0004 1.727 m ( )     Head Circumference --      Peak Flow --      Pain Score 04/08/18 0018 3     Pain Loc --      Pain Edu? --      Excl. in GC? --     Constitutional: Post ictal but responds to simple questions. Eyes: Conjunctivae are normal.  Pupils are equal but sluggish, minimally reactive to light.  No nystagmus, no gaze deviation. Head: Atraumatic. Nose: No congestion/rhinnorhea. Mouth/Throat: Mucous membranes are moist. Neck: No stridor.  No meningeal signs.   Cardiovascular: Mild tachycardia, regular rhythm. Good peripheral circulation. Grossly normal heart sounds. Respiratory: Mild tachypnea but no retractions or accessory muscle usage.  Lung sounds are clear to auscultation bilaterally. Gastrointestinal: Obese.  Soft and nontender. No distention.  Musculoskeletal: No lower extremity tenderness nor edema. No gross deformities of extremities. Neurologic: The patient is postictal and not able to participate fully in a neurological exam but she is moving all 4 extremities and does not appear to have any focal deficits.  I checked her gag reflex with the tongue blade and she reacted immediately and withdrew from the stimulus as well as having a mild gag. Skin:  Skin is warm, dry and intact. No rash noted. Psychiatric: Mood and affect are blunted and flat.  ____________________________________________   LABS (all labs ordered are listed, but only abnormal results are displayed)  Labs Reviewed  COMPREHENSIVE METABOLIC PANEL - Abnormal; Notable for the following components:      Result Value   CO2 15 (*)    Glucose, Bld 139 (*)    Calcium 8.7 (*)    AST 43 (*)    Anion gap 18 (*)     All other components within normal limits  LACTIC ACID, PLASMA - Abnormal; Notable for the following components:   Lactic Acid, Venous 10.6 (*)    All other components within normal limits  URINALYSIS, COMPLETE (UACMP) WITH MICROSCOPIC - Abnormal; Notable for the following components:   Color, Urine YELLOW (*)    APPearance HAZY (*)    Protein, ur 30 (*)    Bacteria, UA RARE (*)    All other components within normal limits  URINE DRUG SCREEN, QUALITATIVE ([facility] ONLY) - Abnormal; Notable for the following components:   Amphetamines, Ur Screen POSITIVE (*)    Cannabinoid 50 Ng, Ur [place] POSITIVE (*)    Benzodiazepine, Ur Scrn POSITIVE (*)    All other components within normal limits  GLUCOSE, CAPILLARY - Abnormal; Notable for the following components:   Glucose-Capillary 123 (*)    All other components within normal limits  ACETAMINOPHEN LEVEL - Abnormal; Notable for the following components:   Acetaminophen (Tylenol), Serum <10 (*)    All other components within normal limits  LIPASE, BLOOD  BRAIN NATRIURETIC PEPTIDE  LACTIC ACID, PLASMA  TROPONIN I  CBC WITH DIFFERENTIAL/PLATELET  PROTIME-INR  ETHANOL  SALICYLATE LEVEL  HIV ANTIBODY (ROUTINE TESTING W REFLEX)  CBC  COMPREHENSIVE METABOLIC PANEL  POC URINE PREG, ED  POCT PREGNANCY, URINE   ____________________________________________  EKG  ED ECG REPORT I, Loleta Rose, the attending physician, personally viewed and interpreted this ECG.  Date: 04/08/2018 EKG Time: 00: 08 Rate: 117 Rhythm: Sinus tachycardia QRS Axis: normal Intervals: normal ST/T Wave abnormalities: No changes of the ST segments or T waves that would suggest acute ischemia.   Narrative Interpretation: no definitive evidence of acute ischemia; does not meet STEMI criteria.     ____________________________________________  RADIOLOGY   ED MD interpretation: Cuboid foot fracture.  Ankle normal, chest x-ray normal, CT scan of the head is  normal.  Official radiology report(s): Dg Chest 1 View  Result Date: 04/08/2018 CLINICAL DATA:  Seizure. Pain after fall. EXAM: CHEST  1 VIEW COMPARISON:  None. FINDINGS: The cardiomediastinal contours are normal. The lungs are clear. Pulmonary vasculature is normal. No consolidation, pleural effusion, or pneumothorax. Plate and screw fixation of left clavicle. Well-circumscribed rounded lucency in the right proximal humerus is likely a postsurgical defect. No acute osseous abnormalities are seen. IMPRESSION: No acute chest findings. Electronically Signed   By: Narda Rutherford M.D.   On: 04/08/2018 00:55   Dg Ankle Complete Right  Result Date: 04/08/2018 CLINICAL DATA:  Pain after fall, seizure. EXAM: RIGHT ANKLE - COMPLETE 3+ VIEW COMPARISON:  None. FINDINGS: Possible midfoot fracture not well evaluated on this ankle exam. No additional fracture of the ankle. The ankle mortise is preserved. Well-defined curvilinear ossification adjacent to the medial malleolus suggest remote prior injury. Plantar calcaneal spur. There is lateral soft tissue edema. IMPRESSION: Lateral soft tissue edema. Possible midfoot fracture not well evaluated on this ankle exam. Recommend dedicated radiographs of the foot. No other fracture of the ankle. Electronically Signed   By: Narda Rutherford M.D.   On: 04/08/2018 00:53   Ct Head Wo Contrast  Result Date: 04/08/2018 CLINICAL DATA:  Initial evaluation for acute seizure. EXAM: CT HEAD WITHOUT CONTRAST TECHNIQUE: Contiguous axial images were obtained from the base of the skull through the vertex without intravenous contrast. COMPARISON:  None available. FINDINGS: Brain: Cerebral volume within normal limits for patient age. No evidence for acute intracranial hemorrhage. No findings to suggest acute large vessel territory infarct. No mass lesion, midline shift, or mass effect. Ventricles are normal in size without evidence for hydrocephalus. No extra-axial fluid collection  identified. Vascular: No hyperdense vessel identified. Skull: Scalp soft tissues demonstrate no acute abnormality. Calvarium intact. Sinuses/Orbits: Globes and orbital soft tissues within normal limits. Visualized paranasal sinuses are clear. No mastoid effusion. IMPRESSION: Normal head CT.  No acute intracranial abnormality. Electronically Signed   By: Rise Mu M.D.   On: 04/08/2018 00:52   Dg Foot Complete Right  Result Date: 04/08/2018 CLINICAL DATA:  Right foot and ankle pain after seizure, fall. Question midfoot fracture on radiograph. EXAM: RIGHT FOOT COMPLETE - 3+ VIEW COMPARISON:  Ankle radiograph earlier this day. FINDINGS: Nondisplaced mildly comminuted cuboid fracture. Fracture extends to the tarsal metatarsal joint. Adjacent fragmented os peroneal, incidental. No additional fracture of the foot. There is lateral soft tissue edema. IMPRESSION: Nondisplaced mildly comminuted cuboid fracture extending to  the tarsal metatarsal joint. Electronically Signed   By: Narda Rutherford M.D.   On: 04/08/2018 01:15    ____________________________________________   PROCEDURES   Procedure(s) performed (including Critical Care):  .Critical Care Performed by: Loleta Rose, MD Authorized by: Loleta Rose, MD   Critical care provider statement:    Critical care time (minutes):  30   Critical care time was exclusive of:  Separately billable procedures and treating other patients   Critical care was necessary to treat or prevent imminent or life-threatening deterioration of the following conditions:  CNS failure or compromise   Critical care was time spent personally by me on the following activities:  Development of treatment plan with patient or surrogate, discussions with consultants, evaluation of patient's response to treatment, examination of patient, obtaining history from patient or surrogate, ordering and performing treatments and interventions, ordering and review of laboratory  studies, ordering and review of radiographic studies, pulse oximetry, re-evaluation of patient's condition and review of old charts     ____________________________________________   INITIAL IMPRESSION / MDM / ASSESSMENT AND PLAN / ED COURSE  As part of my medical decision making, I reviewed the following data within the electronic MEDICAL RECORD NUMBER Nursing notes reviewed and incorporated, Labs reviewed , Old chart reviewed, Radiograph reviewed , Discussed with admitting physician , Notes from prior ED visits and [place] Controlled Substance Database         Differential diagnosis includes, but is not limited to, new onset seizure disorder, substance abuse or medication side effect, intracranial bleeding, ischemic CVA, metabolic or electrolyte abnormality, acute infection such as meningitis.  The patient is afebrile and vital signs are stable and consistent with her current presentation (mild tachycardia is to be expected immediately during and after his seizure).  I witnessed the post ictal state although not the generalized tonic-clonic activity itself, but multiple experienced emergency department nurses were in the room and witnessed the episode and said that it did appear consistent with a generalized seizure.  She is protecting her airway at this time and does not require intubation.  I gave her Ativan 1 mg IV and I have ordered Keppra 1000 mg IV.  Seizure precautions have been ordered and I am obtaining a broad-spectrum of lab work as well as in and out catheterization for urinalysis and urine drug screen, generalized toxicology screening, head CT without contrast, chest x-ray given the possibility of aspiration, and ankle x-rays given that that is the only complaint she is reporting.  Clinical Course as of Apr 08 346  Sun Apr 08, 2018  0045 Preg Test, Ur: NEGATIVE [CF]  [id number] The patient's lab work is notable for a normal CBC, normal comprehensive metabolic panel except for a mildly elevated  anion gap of 18 which is likely related to lactic acidosis.  Her lactic acid is 10.6; this is consistent with seizures, and although the patient is noted to have a lactate>10 with the current information available to me, I do not think the patient is in septic shock. The lactate>4, is related to seizure activity.    [CF]  0057 Urinalysis is normal with no evidence of infection, troponin is normal, lipase is normal, coags are normal.  I am giving her 2 L of normal saline.  The patient's neurological state is steady and she is asleep currently.  Toxicology studies including salicylate, acetaminophen, and ethanol levels are pending.   [CF]  0058 Urine drug screen is notable for positive amphetamines, cannabinoids, and benzodiazepines although I did  give her the Ativan which could have resulted in the positive.   [CF]  0059 Head CT and chest x-ray are normal.  Ankle x-rays do not show an ankle fracture but suggest the possibility of a foot injury.  Radiology recommended a dedicated series of foot radiographs which I have ordered.   [CF]  0227 The patient is awake and alert and in no distress.  She confirmed that her foot hurts and she denied any drug or medication use other than the Adderall she is prescribed.  I explained to her the need to stay in the hospital for further evaluation new onset repeated seizures and she says that she understands.  I will place her in a posterior foot splint for her foot fracture and I discussed the case in person with Dr. Arville Care with the hospitalist service who will admit the patient.  She is stable at this time.   [CF]  0249 Alcohol, Ethyl (B): <10 [CF]    Clinical Course User Index [CF] Loleta Rose, MD      (Note that documentation was delayed due to multiple ED patients requiring immediate care.)   Of note, I realized that the patient was not placed in a splint prior to going upstairs.  I have talked to my charge nurse and she is sending up and ED tech to place  the patient in a splint until she can be seen by podiatry in the morning.  I informed Dr. Arville Care of the hospitalist service and the patient's admitting physician of the plan and he understands and agrees.  ____________________________________________  FINAL CLINICAL IMPRESSION(S) / ED DIAGNOSES  Final diagnoses:  Seizure (HCC)  Lactic acidosis  Elevated lactic acid level  Closed right foot fracture (cuboid fracture)   MEDICATIONS GIVEN DURING THIS VISIT:  Medications  LORazepam (ATIVAN) injection 1 mg (has no administration in time range)  ALPRAZolam (XANAX) tablet 1 mg (has no administration in time range)  0.9 %  sodium chloride infusion (has no administration in time range)  enoxaparin (LOVENOX) injection 40 mg (has no administration in time range)  levETIRAcetam (KEPPRA) tablet 500 mg (has no administration in time range)  LORazepam (ATIVAN) injection 1-2 mg (has no administration in time range)  acetaminophen (TYLENOL) tablet 650 mg (650 mg Oral Given 04/08/18 0310)    Or  acetaminophen (TYLENOL) suppository 650 mg ( Rectal See Alternative 04/08/18 0310)  magnesium hydroxide (MILK OF MAGNESIA) suspension 30 mL (has no administration in time range)  ondansetron (ZOFRAN) tablet 4 mg (has no administration in time range)    Or  ondansetron (ZOFRAN) injection 4 mg (has no administration in time range)  levETIRAcetam (KEPPRA) IVPB 1000 mg/100 mL premix (0 mg Intravenous Stopped 04/08/18 0114)  ondansetron (ZOFRAN) injection 4 mg (4 mg Intravenous Given 04/08/18 0025)  sodium chloride 0.9 % bolus 1,000 mL (1,000 mLs Intravenous Bolus 04/08/18 0033)  sodium chloride 0.9 % bolus 1,000 mL (1,000 mLs Intravenous Bolus 04/08/18 0114)     ED Discharge Orders    None       Note:  This document was prepared using Dragon voice recognition software and may include unintentional dictation errors.   Loleta Rose, MD 04/08/18 [phone]

## 2018-04-08 NOTE — ED Notes (Signed)
ED TO INPATIENT HANDOFF REPORT  ED Nurse Name and Phone #: gracie [phone]  S Name/Age/Gender Alexis Deleon 43 y.o. female Room/Bed: ED02A/ED02A  Code Status   Code Status: Full Code  Home/SNF/Other Home Patient oriented to: self, place, time and situation Is this baseline? Yes   Triage Complete: Triage complete  Chief Complaint Seizures  Triage Note Pt to ED via ems from home. Pt arrives having seizure and frothing at mouth. Per ems pt had 2 seizures with them, pts family reports she was found on floor at home having seizure. Pt has no hx of seizures. Pt post ictal initially. Pt arousal to voice.    Allergies Not on File  Level of Care/Admitting Diagnosis ED Disposition    ED Disposition Condition Comment   Admit  Hospital Area: [facility] [[id number]]  Level of Care: Med-Surg [16]  Diagnosis: Seizure [facility]) [[id number]]  Admitting Physician: Hannah Beat [[id number]]  Attending Physician: Hannah Beat [[id number]]  Estimated length of stay: past midnight tomorrow  Certification:: I certify this patient will need inpatient services for at least 2 midnights  PT Class (Do Not Modify): Inpatient [101]  PT Acc Code (Do Not Modify): Private [1]       B Medical/Surgery History Past Medical History:  Diagnosis Date  . Anxiety   . Bilateral forearm fractures   . Chronic pain   . Depression   . History of pelvic fracture   . Scoliosis    History reviewed. No pertinent surgical history.   A IV Location/Drains/Wounds Patient Lines/Drains/Airways Status   Active Line/Drains/Airways    Name:   Placement date:   Placement time:   Site:   Days:   Peripheral IV 04/08/18 Left Hand   04/08/18    0014    Hand   less than 1   Peripheral IV 04/08/18 Right Hand   04/08/18    0114    Hand   less than 1          Intake/Output Last 24 hours  Intake/Output Summary (Last 24 hours) at 04/08/2018 0251 Last data filed at 04/08/2018 0114 Gross per 24 hour  Intake  92.18 ml  Output -  Net 92.18 ml    Labs/Imaging Results for orders placed or performed during the hospital encounter of 04/08/18 (from the past 48 hour(s))  Comprehensive metabolic panel     Status: Abnormal   Collection Time: 04/08/18 12:12 AM  Result Value Ref Range   Sodium 140 135 - 145 mmol/L   Potassium 3.8 3.5 - 5.1 mmol/L   Chloride 107 98 - 111 mmol/L   CO2 15 (L) 22 - 32 mmol/L   Glucose, Bld 139 (H) 70 - 99 mg/dL   BUN 9 6 - 20 mg/dL   Creatinine, Ser 1.91 0.44 - 1.00 mg/dL   Calcium 8.7 (L) 8.9 - 10.3 mg/dL   Total Protein 7.9 6.5 - 8.1 g/dL   Albumin 4.4 3.5 - 5.0 g/dL   AST 43 (H) 15 - 41 U/L   ALT 21 0 - 44 U/L   Alkaline Phosphatase 55 38 - 126 U/L   Total Bilirubin 0.5 0.3 - 1.2 mg/dL   GFR calc non Af Amer >60 >60 mL/min   GFR calc Af Amer >60 >60 mL/min   Anion gap 18 (H) 5 - 15    Comment: Performed at [facility], [address] [place], [place] [address]  Lipase, blood     Status: None  Collection Time: 04/08/18 12:12 AM  Result Value Ref Range   Lipase 24 11 - 51 U/L    Comment: Performed at [facility], [address] [place], [place] [address]  Brain natriuretic peptide     Status: None   Collection Time: 04/08/18 12:12 AM  Result Value Ref Range   B Natriuretic Peptide 95.0 0.0 - 100.0 pg/mL    Comment: Performed at [facility], [address] [place], [place] [address]  Lactic acid, plasma     Status: Abnormal   Collection Time: 04/08/18 12:12 AM  Result Value Ref Range   Lactic Acid, Venous 10.6 (HH) 0.5 - 1.9 mmol/L    Comment: CRITICAL RESULT CALLED TO, READ BACK BY AND VERIFIED WITH GRACIE [facility] AT [phone] 04/08/2018.  TFK Performed at [facility], [address] [place], [place] [address]   Troponin I - Once     Status: None   Collection Time: 04/08/18 12:12 AM  Result Value Ref Range   Troponin I <0.03 <0.03 ng/mL    Comment: Performed at [facility], [address]  [place], [place] [address]  CBC with Differential     Status: None   Collection Time: 04/08/18 12:12 AM  Result Value Ref Range   WBC 7.4 4.0 - 10.5 K/uL   RBC 4.53 3.87 - 5.11 MIL/uL   Hemoglobin 13.7 12.0 - 15.0 g/dL   HCT 46.9 62.9 - 52.8 %   MCV 91.8 80.0 - 100.0 fL   MCH 30.2 26.0 - 34.0 pg   MCHC 32.9 30.0 - 36.0 g/dL   RDW 41.3 24.4 - 01.0 %   Platelets 322 150 - 400 K/uL   nRBC 0.0 0.0 - 0.2 %   Neutrophils Relative % 82 %   Neutro Abs 6.0 1.7 - 7.7 K/uL   Lymphocytes Relative 14 %   Lymphs Abs 1.1 0.7 - 4.0 K/uL   Monocytes Relative 4 %   Monocytes Absolute 0.3 0.1 - 1.0 K/uL   Eosinophils Relative 0 %   Eosinophils Absolute 0.0 0.0 - 0.5 K/uL   Basophils Relative 0 %   Basophils Absolute 0.0 0.0 - 0.1 K/uL   Immature Granulocytes 0 %   Abs Immature Granulocytes 0.02 0.00 - 0.07 K/uL    Comment: Performed at [facility], [address] [place], [place] [address]  Protime-INR     Status: None   Collection Time: 04/08/18 12:12 AM  Result Value Ref Range   Prothrombin Time 12.9 11.4 - 15.2 seconds   INR 1.0 0.8 - 1.2    Comment: (NOTE) INR goal varies based on device and disease states. Performed at [facility], [address] [place], [place] [address]   Urinalysis, Complete w Microscopic     Status: Abnormal   Collection Time: 04/08/18 12:12 AM  Result Value Ref Range   Color, Urine YELLOW (A) YELLOW   APPearance HAZY (A) CLEAR   Specific Gravity, Urine 1.015 1.005 - 1.030   pH 5.0 5.0 - 8.0   Glucose, UA NEGATIVE NEGATIVE mg/dL   Hgb urine dipstick NEGATIVE NEGATIVE   Bilirubin Urine NEGATIVE NEGATIVE   Ketones, ur NEGATIVE NEGATIVE mg/dL   Protein, ur 30 (A) NEGATIVE mg/dL   Nitrite NEGATIVE NEGATIVE   Leukocytes,Ua NEGATIVE NEGATIVE   RBC / HPF 0-5 0 - 5 RBC/hpf   WBC, UA 0-5 0 - 5 WBC/hpf   Bacteria, UA RARE (A) NONE SEEN   Squamous Epithelial / LPF 0-5  0 - 5   Mucus PRESENT    Hyaline Casts, UA PRESENT    Granular Casts, UA  PRESENT     Comment: Performed at [facility], [address] [place], [place] [address]  Urine Drug Screen, Qualitative     Status: Abnormal   Collection Time: 04/08/18 12:12 AM  Result Value Ref Range   Tricyclic, Ur Screen NONE DETECTED NONE DETECTED   Amphetamines, Ur Screen POSITIVE (A) NONE DETECTED   MDMA (Ecstasy)Ur Screen NONE DETECTED NONE DETECTED   Cocaine Metabolite,Ur [place] NONE DETECTED NONE DETECTED   Opiate, Ur Screen NONE DETECTED NONE DETECTED   Phencyclidine (PCP) Ur S NONE DETECTED NONE DETECTED   Cannabinoid 50 Ng, Ur [place] POSITIVE (A) NONE DETECTED   Barbiturates, Ur Screen NONE DETECTED NONE DETECTED   Benzodiazepine, Ur Scrn POSITIVE (A) NONE DETECTED   Methadone Scn, Ur NONE DETECTED NONE DETECTED    Comment: (NOTE) Tricyclics + metabolites, urine    Cutoff 1000 ng/mL Amphetamines + metabolites, urine  Cutoff 1000 ng/mL MDMA (Ecstasy), urine              Cutoff 500 ng/mL Cocaine Metabolite, urine          Cutoff 300 ng/mL Opiate + metabolites, urine        Cutoff 300 ng/mL Phencyclidine (PCP), urine         Cutoff 25 ng/mL Cannabinoid, urine                 Cutoff 50 ng/mL Barbiturates + metabolites, urine  Cutoff 200 ng/mL Benzodiazepine, urine              Cutoff 200 ng/mL Methadone, urine                   Cutoff 300 ng/mL The urine drug screen provides only a preliminary, unconfirmed analytical test result and should not be used for non-medical purposes. Clinical consideration and professional judgment should be applied to any positive drug screen result due to possible interfering substances. A more specific alternate chemical method must be used in order to obtain a confirmed analytical result. Gas chromatography / mass spectrometry (GC/MS) is the preferred confirmat ory method. Performed at [facility], [address] [place], [place] [address]   Pregnancy, urine POC     Status: None   Collection Time: 04/08/18 12:20 AM   Result Value Ref Range   Preg Test, Ur NEGATIVE NEGATIVE    Comment:        THE SENSITIVITY OF THIS METHODOLOGY IS >24 mIU/mL   Glucose, capillary     Status: Abnormal   Collection Time: 04/08/18 12:25 AM  Result Value Ref Range   Glucose-Capillary 123 (H) 70 - 99 mg/dL  Lactic acid, plasma     Status: None   Collection Time: 04/08/18  2:01 AM  Result Value Ref Range   Lactic Acid, Venous 1.2 0.5 - 1.9 mmol/L    Comment: Performed at [facility], [address] [place], [place] [address]  Ethanol     Status: None   Collection Time: 04/08/18  2:01 AM  Result Value Ref Range   Alcohol, Ethyl (B) <10 <10 mg/dL    Comment: (NOTE) Lowest detectable limit for serum alcohol is 10 mg/dL. For medical purposes only. Performed at [facility], [address], [place], [place] [address]    Dg Chest 1 View  Result Date: 04/08/2018 CLINICAL DATA:  Seizure. Pain after fall. EXAM: CHEST  1 VIEW COMPARISON:  None. FINDINGS: The cardiomediastinal contours are normal. The lungs are clear. Pulmonary vasculature is normal. No consolidation, pleural effusion, or pneumothorax. Plate and screw fixation of left clavicle. Well-circumscribed rounded lucency in the right proximal humerus is likely a postsurgical defect. No acute osseous abnormalities are seen. IMPRESSION: No acute chest findings. Electronically Signed   By: Narda Rutherford M.D.   On: 04/08/2018 00:55   Dg Ankle Complete Right  Result Date: 04/08/2018 CLINICAL DATA:  Pain after fall, seizure. EXAM: RIGHT ANKLE - COMPLETE 3+ VIEW COMPARISON:  None. FINDINGS: Possible midfoot fracture not well evaluated on this ankle exam. No additional fracture of the ankle. The ankle mortise is preserved. Well-defined curvilinear ossification adjacent to the medial malleolus suggest remote prior injury. Plantar calcaneal spur. There is lateral soft tissue edema. IMPRESSION: Lateral soft tissue edema. Possible midfoot fracture not well  evaluated on this ankle exam. Recommend dedicated radiographs of the foot. No other fracture of the ankle. Electronically Signed   By: Narda Rutherford M.D.   On: 04/08/2018 00:53   Ct Head Wo Contrast  Result Date: 04/08/2018 CLINICAL DATA:  Initial evaluation for acute seizure. EXAM: CT HEAD WITHOUT CONTRAST TECHNIQUE: Contiguous axial images were obtained from the base of the skull through the vertex without intravenous contrast. COMPARISON:  None available. FINDINGS: Brain: Cerebral volume within normal limits for patient age. No evidence for acute intracranial hemorrhage. No findings to suggest acute large vessel territory infarct. No mass lesion, midline shift, or mass effect. Ventricles are normal in size without evidence for hydrocephalus. No extra-axial fluid collection identified. Vascular: No hyperdense vessel identified. Skull: Scalp soft tissues demonstrate no acute abnormality. Calvarium intact. Sinuses/Orbits: Globes and orbital soft tissues within normal limits. Visualized paranasal sinuses are clear. No mastoid effusion. IMPRESSION: Normal head CT.  No acute intracranial abnormality. Electronically Signed   By: Rise Mu M.D.   On: 04/08/2018 00:52   Dg Foot Complete Right  Result Date: 04/08/2018 CLINICAL DATA:  Right foot and ankle pain after seizure, fall. Question midfoot fracture on radiograph. EXAM: RIGHT FOOT COMPLETE - 3+ VIEW COMPARISON:  Ankle radiograph earlier this day. FINDINGS: Nondisplaced mildly comminuted cuboid fracture. Fracture extends to the tarsal metatarsal joint. Adjacent fragmented os peroneal, incidental. No additional fracture of the foot. There is lateral soft tissue edema. IMPRESSION: Nondisplaced mildly comminuted cuboid fracture extending to the tarsal metatarsal joint. Electronically Signed   By: Narda Rutherford M.D.   On: 04/08/2018 01:15    Pending Labs Unresulted Labs (From admission, onward)    Start     Ordered   04/08/18 0500  CBC   Once,   STAT     04/08/18 0245   04/08/18 0500  Comprehensive metabolic panel  Once,   STAT     04/08/18 0245   04/08/18 0240  HIV antibody (Routine Testing)  Once,   STAT     04/08/18 0245   04/08/18 0045  Acetaminophen level  Once,   STAT     04/08/18 0045   04/08/18 0045  Salicylate level  Once,   STAT     04/08/18 0045          Vitals/Pain Today's Vitals   04/08/18 0030 04/08/18 0036 04/08/18 0100 04/08/18 0130  BP: 127/83 127/83 127/75 126/71  Pulse: 99  94 88  Resp: (!) 21  (!) 21 20  Temp:      TempSrc:      SpO2: 98%  99% 100%  Weight:  Height:      PainSc:        Isolation Precautions No active isolations  Medications Medications  LORazepam (ATIVAN) injection 1 mg (has no administration in time range)  ALPRAZolam (XANAX) tablet 1 mg (has no administration in time range)  0.9 %  sodium chloride infusion (has no administration in time range)  enoxaparin (LOVENOX) injection 40 mg (has no administration in time range)  levETIRAcetam (KEPPRA) tablet 500 mg (has no administration in time range)  LORazepam (ATIVAN) injection 1-2 mg (has no administration in time range)  acetaminophen (TYLENOL) tablet 650 mg (has no administration in time range)    Or  acetaminophen (TYLENOL) suppository 650 mg (has no administration in time range)  magnesium hydroxide (MILK OF MAGNESIA) suspension 30 mL (has no administration in time range)  ondansetron (ZOFRAN) tablet 4 mg (has no administration in time range)    Or  ondansetron (ZOFRAN) injection 4 mg (has no administration in time range)  levETIRAcetam (KEPPRA) IVPB 1000 mg/100 mL premix (0 mg Intravenous Stopped 04/08/18 0114)  ondansetron (ZOFRAN) injection 4 mg (4 mg Intravenous Given 04/08/18 0025)  sodium chloride 0.9 % bolus 1,000 mL (1,000 mLs Intravenous Bolus 04/08/18 0033)  sodium chloride 0.9 % bolus 1,000 mL (1,000 mLs Intravenous Bolus 04/08/18 0114)    Mobility walks High fall risk   Focused  Assessments Neuro Assessment Handoff:  Swallow screen pass? n/a         Neuro Assessment:   Neuro Checks:      Last Documented NIHSS Modified Score:   Has TPA been given? No If patient is a Neuro Trauma and patient is going to OR before floor call report to 4N Charge nurse: [phone] or [phone]     R Recommendations: See Admitting Provider Note  Report given to:   Additional Notes:

## 2018-04-08 NOTE — ED Notes (Signed)
Patient transported to CT 

## 2018-04-08 NOTE — ED Triage Notes (Signed)
Pt to ED via ems from home. Pt arrives having seizure and frothing at mouth. Per ems pt had 2 seizures with them, pts family reports she was found on floor at home having seizure. Pt has no hx of seizures. Pt post ictal initially. Pt arousal to voice.

## 2018-04-08 NOTE — Plan of Care (Signed)

## 2018-04-08 NOTE — Consult Note (Signed)
[facility]                                                      Patient Demographics  Alexis Deleon, is a 43 y.o. female   MRN: [id number]   DOB - Nov 20, 1975  Admit Date - 04/08/2018    Outpatient Primary MD for the patient is Patient, No Pcp Per  Consult requested in the Hospital by Bettey Costa, MD, On 04/08/2018    Reason for consult injury/fracture cuboid right foot   With History of -  Past Medical History:  Diagnosis Date  . Anxiety   . Bilateral forearm fractures   . Chronic pain   . Depression   . History of pelvic fracture   . Scoliosis       History reviewed. No pertinent surgical history.  in for   Chief Complaint  Patient presents with  . Seizures     HPI  Alexis Deleon  is a 43 y.o. female, patient fell yesterday when she had a seizure and injured her right foot.  She was brought to the emergency room and has been stabilized with the seizure activity but had swelling and pain in her foot.  The emergency room put a posterior splint on her right foot in the ER.  X-ray showed a fractured cuboid.    Review of Systems: Patient is alert and appears to be well oriented that she is having some pain at the present.  Is not particularly communicative at this point.  She does answer questions correctly.  In addition to the HPI above,  No Fever-chills, Recent seizure activity which is according to her new for she states she is never had seizures until yesterday. No problems swallowing food or Liquids, No Chest pain, Cough or Shortness of Breath, No Abdominal pain, No Nausea or Vommitting, Bowel movements are regular, No Blood in stool or Urine, No dysuria, No new skin rashes or bruises, Injured right foot and ankle with findings below No new weakness, tingling, numbness in any extremity, No recent weight gain or loss, No  polyuria, polydypsia or polyphagia, No significant Mental Stressors.  A full 10 point Review of Systems was done, except as stated above, all other Review of Systems were negative.   Social History Social History   Tobacco Use  . Smoking status: Never Smoker  . Smokeless tobacco: Never Used  Substance Use Topics  . Alcohol use: Yes    Family History History reviewed. No pertinent family history.  Prior to Admission medications   Medication Sig Start Date End Date Taking? Authorizing Provider  ALPRAZolam Duanne Moron) 1 MG tablet Take 1 tablet by mouth 2 (two) times daily as needed. 04/03/18  Yes [provider]  amphetamine-dextroamphetamine (ADDERALL) 30 MG tablet Take 1 tablet by mouth 2 (two) times daily as needed. 03/12/18 03/12/19 Yes [provider]  HYDROcodone-acetaminophen (NORCO/VICODIN) 5-325 MG tablet Take 1 tablet by mouth 2 (two) times daily. 03/13/18  Yes [provider]  phentermine (ADIPEX-P) 37.5 MG tablet Take 1 tablet by mouth every morning. 04/03/18  Yes [provider]    Anti-infectives (From admission, onward)   None      Scheduled Meds: . enoxaparin (LOVENOX) injection  40 mg Subcutaneous Q24H  . [START ON 04/09/2018] Influenza vac split quadrivalent PF  0.5 mL Intramuscular Tomorrow-1000  .  levETIRAcetam  500 mg Oral BID  . LORazepam      . LORazepam  1 mg Intravenous Once   Continuous Infusions: . sodium chloride 75 mL/hr (04/08/18 0502)   PRN Meds:.acetaminophen **OR** acetaminophen, ALPRAZolam, LORazepam, magnesium hydroxide, ondansetron **OR** ondansetron (ZOFRAN) IV, oxyCODONE-acetaminophen  Not on File  Physical Exam  Vitals  Blood pressure 128/72, pulse 87, temperature (!) 97.5 F (36.4 C), temperature source Oral, resp. rate (!) 22, height 5' 7"  (1.702 m), weight 101.5 kg, SpO2 100 %.  Lower Extremity exam:  Vascular: DP pulses are +2/4 bilateral  Dermatological: No open sores abrasions or wounds are noted  on the ankle.  Neurological: Deferred at present due to pain  Ortho: Patient has swelling and ecchymosis along the lateral right foot and ankle.  Tenderness with range of motion across the ankle joint and also with light touching of the foot.  A posterior splint is on the foot leg but it is somewhat crooked I remove that and evaluated her foot and ankle.  X-rays were reviewed by me and show a comminuted fracture to the cuboid bone.  This consistent with a "nutcracker" type injury where the cuboid is compressed between the metatarsals and the calcaneus.  Fracture margins appear to progress to the fifth met cuboid joint as well as the calcaneocuboid joint.  Alignment overall is fairly good and I do not see any reason for invasive treatment at this point.  Data Review  CBC Recent Labs  Lab 04/08/18 0012 04/08/18 0455  WBC 7.4 9.7  HGB 13.7 12.1  HCT 41.6 36.3  PLT 322 246  MCV 91.8 89.2  MCH 30.2 29.7  MCHC 32.9 33.3  RDW 12.2 12.3  LYMPHSABS 1.1  --   MONOABS 0.3  --   EOSABS 0.0  --   BASOSABS 0.0  --    ------------------------------------------------------------------------------------------------------------------  Chemistries  Recent Labs  Lab 04/08/18 0012 04/08/18 0455  NA 140 138  K 3.8 3.9  CL 107 110  CO2 15* 22  GLUCOSE 139* 116*  BUN 9 7  CREATININE 0.93 0.64  CALCIUM 8.7* 7.8*  AST 43* 28  ALT 21 19  ALKPHOS 55 49  BILITOT 0.5 0.5   ------------------------------------------------------------------------------------------------------------------ estimated creatinine clearance is 111.1 mL/min (by C-G formula based on SCr of 0.64 mg/dL). ------------------------------------------------------------------------------------------------------------------ No results for input(s): TSH, T4TOTAL, T3FREE, THYROIDAB in the last 72 hours.  Invalid input(s): FREET3 Urinalysis    Component Value Date/Time   COLORURINE YELLOW (A) 04/08/2018 0012   APPEARANCEUR  HAZY (A) 04/08/2018 0012   APPEARANCEUR Hazy 02/01/2013 1210   LABSPEC 1.015 04/08/2018 0012   LABSPEC 1.021 02/01/2013 1210   PHURINE 5.0 04/08/2018 0012   GLUCOSEU NEGATIVE 04/08/2018 0012   GLUCOSEU Negative 02/01/2013 1210   HGBUR NEGATIVE 04/08/2018 0012   BILIRUBINUR NEGATIVE 04/08/2018 0012   BILIRUBINUR Negative 02/01/2013 1210   KETONESUR NEGATIVE 04/08/2018 0012   PROTEINUR 30 (A) 04/08/2018 0012   NITRITE NEGATIVE 04/08/2018 0012   LEUKOCYTESUR NEGATIVE 04/08/2018 0012   LEUKOCYTESUR Negative 02/01/2013 1210     Imaging results:   Dg Chest 1 View  Result Date: 04/08/2018 CLINICAL DATA:  Seizure. Pain after fall. EXAM: CHEST  1 VIEW COMPARISON:  None. FINDINGS: The cardiomediastinal contours are normal. The lungs are clear. Pulmonary vasculature is normal. No consolidation, pleural effusion, or pneumothorax. Plate and screw fixation of left clavicle. Well-circumscribed rounded lucency in the right proximal humerus is likely a postsurgical defect. No acute osseous abnormalities are seen. IMPRESSION: No acute  chest findings. Electronically Signed   By: Keith Rake M.D.   On: 04/08/2018 00:55   Dg Ankle Complete Right  Result Date: 04/08/2018 CLINICAL DATA:  Pain after fall, seizure. EXAM: RIGHT ANKLE - COMPLETE 3+ VIEW COMPARISON:  None. FINDINGS: Possible midfoot fracture not well evaluated on this ankle exam. No additional fracture of the ankle. The ankle mortise is preserved. Well-defined curvilinear ossification adjacent to the medial malleolus suggest remote prior injury. Plantar calcaneal spur. There is lateral soft tissue edema. IMPRESSION: Lateral soft tissue edema. Possible midfoot fracture not well evaluated on this ankle exam. Recommend dedicated radiographs of the foot. No other fracture of the ankle. Electronically Signed   By: Keith Rake M.D.   On: 04/08/2018 00:53   Ct Head Wo Contrast  Result Date: 04/08/2018 CLINICAL DATA:  Initial evaluation for  acute seizure. EXAM: CT HEAD WITHOUT CONTRAST TECHNIQUE: Contiguous axial images were obtained from the base of the skull through the vertex without intravenous contrast. COMPARISON:  None available. FINDINGS: Brain: Cerebral volume within normal limits for patient age. No evidence for acute intracranial hemorrhage. No findings to suggest acute large vessel territory infarct. No mass lesion, midline shift, or mass effect. Ventricles are normal in size without evidence for hydrocephalus. No extra-axial fluid collection identified. Vascular: No hyperdense vessel identified. Skull: Scalp soft tissues demonstrate no acute abnormality. Calvarium intact. Sinuses/Orbits: Globes and orbital soft tissues within normal limits. Visualized paranasal sinuses are clear. No mastoid effusion. IMPRESSION: Normal head CT.  No acute intracranial abnormality. Electronically Signed   By: Jeannine Boga M.D.   On: 04/08/2018 00:52   Dg Foot Complete Right  Result Date: 04/08/2018 CLINICAL DATA:  Right foot and ankle pain after seizure, fall. Question midfoot fracture on radiograph. EXAM: RIGHT FOOT COMPLETE - 3+ VIEW COMPARISON:  Ankle radiograph earlier this day. FINDINGS: Nondisplaced mildly comminuted cuboid fracture. Fracture extends to the tarsal metatarsal joint. Adjacent fragmented os peroneal, incidental. No additional fracture of the foot. There is lateral soft tissue edema. IMPRESSION: Nondisplaced mildly comminuted cuboid fracture extending to the tarsal metatarsal joint. Electronically Signed   By: Keith Rake M.D.   On: 04/08/2018 01:15    Assessment & Plan: I changed out the splint and put one on that will hold her foot in a better right ankle.  This will help in case she had an anterior talofibular ligament injury as well as the cuboid fracture.  I explained to her I want her to be nonweightbearing on this.  I will write an order for physical therapy to work with her to be able to utilize crutches or  walker and/or a scooter in order to remain on.  Warned her that she needs to be extremely careful no matter what mode she uses because of the recent seizure activity.  I would like to see her back in my office a week from Monday possibly put her in a cast at that point.  She needs to remain nonweightbearing on foot.  Weightbearing could cause stresses to the cuboid which would allow shift from movement and resulting in the need of possible surgery and possible increased risk of posttraumatic arthritis  Active Problems:   Seizure [facility])   Family Communication: Plan discussed with patient   Albertine Patricia M.D on 04/08/2018 at 9:59 AM  Thank you for the consult, we will follow the patient with you in the Hospital.

## 2018-04-08 NOTE — Progress Notes (Signed)
PT Cancellation Note  Patient Details Name: KATTI DONEHOO MRN: [id number] DOB: 1975/05/13   Cancelled Treatment:    Reason Eval/Treat Not Completed: Patient at procedure or test/unavailable(at MRI). Patient not in room upon PT arrival, nurse informed PT that patient is at her MRI at this moment. Will attempt again at a later time.   Precious Bard, PT, DPT   04/08/2018, 2:29 PM

## 2018-04-08 NOTE — ED Notes (Signed)
Pts significant other, [name]: [phone]

## 2018-04-08 NOTE — H&P (Addendum)
[facility] [place] [facility]   PATIENT NAME: Alexis Deleon    MR#:  [id number]  DATE OF BIRTH:  [birth year]  DATE OF ADMISSION:  04/08/2018  PRIMARY CARE PHYSICIAN: Zenaida Niece, MD  REQUESTING/REFERRING PHYSICIAN: Loleta Rose, MD  CHIEF COMPLAINT:   Chief Complaint  Patient presents with   Seizures    HISTORY OF PRESENT ILLNESS:  Alexis Deleon  is a 43 y.o. female with a known history of adult ADD, peripheral neuropathy and anxiety, who presented to the emergency room with acute onset of recurrent new onset seizures.  The patient initially had a seizure episode witnessed by her boyfriend as tonic-clonic with unresponsiveness and froth at her mouth.  Upon arrival of paramedics she was awake and alert but sleepy.  She then had another episode and stopped seizing by the time she got to the ambulance and was postictal but was alert by the time they brought her to the hospital.  She was answering questions appropriately.  Immediately after arrival in the ED she had another generalized tonic-clonic seizures witnessed by ED staff.  Upon arrival of the ER physician the patient stopped seizing and was clearly postictal.  She was able to answer a few simple questions then.  She complained of right ankle pain as she fell during her fears seizure episode.  An x-ray revealed right nondisplaced mildly comminuted cuboid fracture extending to her tarsal-metatarsal joint.  She admitted to headache all day prior to her seizures.  No dizziness or blurred vision.  No paresthesias or focal muscle weakness.  She denied any previous history of seizures..  No nausea or vomiting or abdominal pain.  No chest pain or dyspnea or palpitations.  Upon presentation to the emergency room vital signs revealed a heart rate of 119 with respiratory to 21 approximately 99% on 2 L of O2 by nasal cannula with temperature of 97.7 and blood pressure 127/83.  Labs were remarkable for  significantly elevated lactic acid of 10.6 Declaire came down to 1.2, anion gap of 18 and her blood glucose is 139.  Urine drug screen was positive for marijuana, amphetamine and benzodiazepine.  Urine pregnancy test was negative.  Alcohol level was less than 10.  EKG showed sinus tachycardia with rate 117.  Head CT scan revealed no acute intracranial abnormalities.  The patient was given 1 mg of IV Ativan and 1000 mg of IV Keppra as well as 4 mg of IV Zofran and 2 L bolus of IV normal saline.  She will be admitted to a medically monitored bed for further evaluation and management. PAST MEDICAL HISTORY:   Past Medical History:  Diagnosis Date   Anxiety    Bilateral forearm fractures    Chronic pain    Depression    History of pelvic fracture    Scoliosis     PAST SURGICAL HISTORY:  History reviewed. No pertinent surgical history.  She denied any previous surgeries  SOCIAL HISTORY:   Social History   Tobacco Use   Smoking status: Never Smoker   Smokeless tobacco: Never Used  Substance Use Topics   Alcohol use: Yes    FAMILY HISTORY:  History reviewed. No pertinent family history.  She denied any familial diseases  DRUG ALLERGIES:  Not on File.  She did not report any drug allergies  REVIEW OF SYSTEMS:   ROS As per history of present illness. All pertinent systems were reviewed above. Constitutional,  HEENT, cardiovascular, respiratory, GI, GU, musculoskeletal, neuro, psychiatric,  endocrine,  integumentary and hematologic systems were reviewed and are otherwise  negative/unremarkable except for positive findings mentioned above in the HPI.   MEDICATIONS AT HOME:   Prior to Admission medications   Medication Sig Start Date End Date Taking? Authorizing Provider  ALPRAZolam Prudy Feeler) 1 MG tablet Take 1 tablet by mouth 2 (two) times daily as needed. 04/03/18  Yes [provider]  amphetamine-dextroamphetamine (ADDERALL) 30 MG tablet Take 1 tablet by mouth 2  (two) times daily as needed. 03/12/18 03/12/19 Yes [provider]  HYDROcodone-acetaminophen (NORCO/VICODIN) 5-325 MG tablet Take 1 tablet by mouth 2 (two) times daily. 03/13/18  Yes [provider]  phentermine (ADIPEX-P) 37.5 MG tablet Take 1 tablet by mouth every morning. 04/03/18  Yes [provider]      VITAL SIGNS:  Blood pressure 128/72, pulse 87, temperature (!) 97.5 F (36.4 C), temperature source Oral, resp. rate (!) 22, height 5\' 8"  (1.727 m), weight 86.2 kg, SpO2 100 %.  PHYSICAL EXAMINATION:  Physical Exam  GENERAL:  43 y.o.-year-old Caucasian female patient lying in the bed with no acute distress.  EYES: Pupils equal, round, reactive to light and accommodation. No scleral icterus. Extraocular muscles intact.  HEENT: Head atraumatic, normocephalic. Oropharynx and nasopharynx clear.  NECK:  Supple, no jugular venous distention. No thyroid enlargement, no tenderness.  LUNGS: Normal breath sounds bilaterally, no wheezing, rales,rhonchi or crepitation. No use of accessory muscles of respiration.  CARDIOVASCULAR: S1, S2 normal. No murmurs, rubs, or gallops.  ABDOMEN: Soft, nontender, nondistended. Bowel sounds present. No organomegaly or mass.  EXTREMITIES/musculoskeletal: No pedal leg edema, cyanosis, or clubbing.  She has swollen and tender proximal lateral and midfoot with decreased range of motion secondary to pain. NEUROLOGIC: Cranial nerves II through XII are intact. Muscle strength 5/5 in all extremities. Sensation intact. Gait not checked.  PSYCHIATRIC: The patient is alert and oriented x 3.  SKIN: No obvious rash, lesion, or ulcer.   LABORATORY PANEL:   CBC Recent Labs  Lab 04/08/18 0455  WBC 9.7  HGB 12.1  HCT 36.3  PLT 246   ------------------------------------------------------------------------------------------------------------------  Chemistries  Recent Labs  Lab 04/08/18 0012  NA 140  K 3.8  CL 107  CO2 15*  GLUCOSE 139*    BUN 9  CREATININE 0.93  CALCIUM 8.7*  AST 43*  ALT 21  ALKPHOS 55  BILITOT 0.5   ------------------------------------------------------------------------------------------------------------------  Cardiac Enzymes Recent Labs  Lab 04/08/18 0012  TROPONINI <0.03   ------------------------------------------------------------------------------------------------------------------  RADIOLOGY:  Dg Chest 1 View  Result Date: 04/08/2018 CLINICAL DATA:  Seizure. Pain after fall. EXAM: CHEST  1 VIEW COMPARISON:  None. FINDINGS: The cardiomediastinal contours are normal. The lungs are clear. Pulmonary vasculature is normal. No consolidation, pleural effusion, or pneumothorax. Plate and screw fixation of left clavicle. Well-circumscribed rounded lucency in the right proximal humerus is likely a postsurgical defect. No acute osseous abnormalities are seen. IMPRESSION: No acute chest findings. Electronically Signed   By: Narda Rutherford M.D.   On: 04/08/2018 00:55   Dg Ankle Complete Right  Result Date: 04/08/2018 CLINICAL DATA:  Pain after fall, seizure. EXAM: RIGHT ANKLE - COMPLETE 3+ VIEW COMPARISON:  None. FINDINGS: Possible midfoot fracture not well evaluated on this ankle exam. No additional fracture of the ankle. The ankle mortise is preserved. Well-defined curvilinear ossification adjacent to the medial malleolus suggest remote prior injury. Plantar calcaneal spur. There is lateral soft tissue edema. IMPRESSION: Lateral soft tissue edema. Possible midfoot fracture not well evaluated on this ankle  exam. Recommend dedicated radiographs of the foot. No other fracture of the ankle. Electronically Signed   By: Narda Rutherford M.D.   On: 04/08/2018 00:53   Ct Head Wo Contrast  Result Date: 04/08/2018 CLINICAL DATA:  Initial evaluation for acute seizure. EXAM: CT HEAD WITHOUT CONTRAST TECHNIQUE: Contiguous axial images were obtained from the base of the skull through the vertex without intravenous  contrast. COMPARISON:  None available. FINDINGS: Brain: Cerebral volume within normal limits for patient age. No evidence for acute intracranial hemorrhage. No findings to suggest acute large vessel territory infarct. No mass lesion, midline shift, or mass effect. Ventricles are normal in size without evidence for hydrocephalus. No extra-axial fluid collection identified. Vascular: No hyperdense vessel identified. Skull: Scalp soft tissues demonstrate no acute abnormality. Calvarium intact. Sinuses/Orbits: Globes and orbital soft tissues within normal limits. Visualized paranasal sinuses are clear. No mastoid effusion. IMPRESSION: Normal head CT.  No acute intracranial abnormality. Electronically Signed   By: Rise Mu M.D.   On: 04/08/2018 00:52   Dg Foot Complete Right  Result Date: 04/08/2018 CLINICAL DATA:  Right foot and ankle pain after seizure, fall. Question midfoot fracture on radiograph. EXAM: RIGHT FOOT COMPLETE - 3+ VIEW COMPARISON:  Ankle radiograph earlier this day. FINDINGS: Nondisplaced mildly comminuted cuboid fracture. Fracture extends to the tarsal metatarsal joint. Adjacent fragmented os peroneal, incidental. No additional fracture of the foot. There is lateral soft tissue edema. IMPRESSION: Nondisplaced mildly comminuted cuboid fracture extending to the tarsal metatarsal joint. Electronically Signed   By: Narda Rutherford M.D.   On: 04/08/2018 01:15      IMPRESSION AND PLAN:   #1.  New onset recurrent seizures.  The patient will be admitted to a medically monitored bed.  She will be placed on seizures precautions.  She will be placed on PRN IV Ativan and we will continue maintenance dose of p.o. Keppra.  A neurology consultation will be obtained.  We will obtain an EEG.  I contacted Dr. Thad Ranger regarding the patient.  Etiology for her seizure is unclear, whether it is an epileptic focus.  I am not sure about her compliance with Xanax which withdrawal can be possible  reason, in addition to her marijuana abuse.  She was counseled for cessation.  2.  Right foot cuboid bone fracture.  She will have a foot splint and a podiatrist consultation by Dr. Orland Jarred will be obtained.  I contacted him regarding the patient..  Pain management will be provided.  3.  Adult ADD.  I will hold off her Adderall for now.  4.  Anxiety.  Her Xanax will be resumed.  5.  DVT prophylaxis.  This will be provided with subcutaneous Lovenox.   All the records are reviewed and case discussed with ED provider. Management plans discussed with the patient, family and they are in agreement.  CODE STATUS: Full code  TOTAL TIME TAKING CARE OF THIS PATIENT: 55 minutes.    Hannah Beat M.D on 04/08/2018 at 5:32 AM  Pager - [phone]  After 6pm go to [email or web address] - Social research officer, government  [facility]  Office  [phone]  CC: Primary care physician; Patient, No Pcp Per   Note: This dictation was prepared with Dragon dictation along with smaller phrase technology. Any transcriptional errors that result from this process are unintentional.

## 2018-04-08 NOTE — Progress Notes (Signed)
Patient complaining of 10/10 headache pain, received verbal order for one time dose of toradol 30mg .

## 2018-04-09 ENCOUNTER — Inpatient Hospital Stay: Payer: Self-pay

## 2018-04-09 LAB — CBC
HCT: 35.9 % — ABNORMAL LOW (ref 36.0–46.0)
Hemoglobin: 12.2 g/dL (ref 12.0–15.0)
MCH: 30.6 pg (ref 26.0–34.0)
MCHC: 34 g/dL (ref 30.0–36.0)
MCV: 90 fL (ref 80.0–100.0)
Platelets: 286 10*3/uL (ref 150–400)
RBC: 3.99 MIL/uL (ref 3.87–5.11)
RDW: 12.8 % (ref 11.5–15.5)
WBC: 9.2 10*3/uL (ref 4.0–10.5)
nRBC: 0 % (ref 0.0–0.2)

## 2018-04-09 LAB — BASIC METABOLIC PANEL
Anion gap: 11 (ref 5–15)
BUN: 9 mg/dL (ref 6–20)
CALCIUM: 8.5 mg/dL — AB (ref 8.9–10.3)
CO2: 20 mmol/L — ABNORMAL LOW (ref 22–32)
Chloride: 106 mmol/L (ref 98–111)
Creatinine, Ser: 0.54 mg/dL (ref 0.44–1.00)
GFR calc Af Amer: >60 mL/min (ref >60)
GFR calc non Af Amer: >60 mL/min (ref >60)
Glucose, Bld: 91 mg/dL (ref 70–99)
Potassium: 3.3 mmol/L — ABNORMAL LOW (ref 3.5–5.1)
Sodium: 137 mmol/L (ref 135–145)

## 2018-04-09 MED ORDER — LACOSAMIDE 100 MG PO TABS: 100.0000 mg | ORAL_TABLET | Freq: Two times a day (BID) | ORAL | 0 refills | Status: DC

## 2018-04-09 MED ORDER — POTASSIUM CHLORIDE CRYS ER 20 MEQ PO TBCR
40.0000 meq | EXTENDED_RELEASE_TABLET | Freq: Once | ORAL | Status: AC
  Administered 2018-04-09: 40 meq via ORAL
  Filled 2018-04-09: qty 2

## 2018-04-09 NOTE — Progress Notes (Signed)
   04/09/18 1000  Clinical Encounter Type  Visited With Patient  Ch was rounding. Pt has her boyfriend as emotional support person. Pt seemed content.

## 2018-04-09 NOTE — Care Management (Signed)
[employer] walker provided

## 2018-04-09 NOTE — Plan of Care (Signed)

## 2018-04-09 NOTE — TOC Initial Note (Addendum)
Transition of Care [facility]) - Initial/Assessment Note    Patient Details  Name: Alexis Deleon MRN: [id number] Date of Birth: 04/06/1975  Transition of Care [facility]) CM/SW Contact:    Alexis Manifold, RN Phone Number: 04/09/2018, 8:16 AM  Clinical Narrative:  Alexis Deleon team consulted. Patient typically lives at home with signficant other Alexis Deleon [phone]. Patient is completely independent at baseline and requires no DME. Patient still drives. PCP is Alexis Deleon at [facility] family [facility]. Patient had witnessed seizures which are new onset for the patient. Neuro following. Patient still agitated/confused. Per Neuro we will attempt MRI of the brain once patient is more stable. Also there are questions about medication usage since patient takes several narcotics for chronic Deleon and ADHD and its possible correlation to this new seizures. Medication management and [facility] application left at bedside. TOC to assist with medications at discharge since patient is without payor.               Update 14;37- Patient being recommend HHPT. Patient has used [facility] outpatient [facility] when she fractured her pelvis. This is her preference. I have provided her with a list of all outpatient agencies in the area. She plans to call tomorrow. Patient has PCP at [facility], Alexis Deleon. She understands PCP may need to sign outpatient orders.   Update 1603- Patient now prefers HH. SHe is worried about transportation and several of the outpatient clinics being closed. Notified Alexis Deleon of [facility] who can provide charity PT.    Expected Discharge Plan: Home/Self Care Barriers to Discharge: Continued Medical Work up   Patient Goals and CMS Choice        Expected Discharge Plan and Services Expected Discharge Plan: Home/Self Care   Discharge Planning Services: CM Consult                              Prior Living Arrangements/Services   Lives with:: Significant Other Patient language and need  for interpreter reviewed:: No Do you feel safe going back to the place where you live?: Yes      Need for Family Participation in Patient Care: Yes (Comment) Care giver support system in place?: Yes (comment)(sig other)   Criminal Activity/Legal Involvement Pertinent to Current Situation/Hospitalization: No - Comment as needed  Activities of Daily Living Home Assistive Devices/Equipment: None ADL Screening (condition at time of admission) Patient's cognitive ability adequate to safely complete daily activities?: Yes Is the patient deaf or have difficulty hearing?: No Does the patient have difficulty seeing, even when wearing glasses/contacts?: No Does the patient have difficulty concentrating, remembering, or making decisions?: No Patient able to express need for assistance with ADLs?: Yes Does the patient have difficulty dressing or bathing?: No Independently performs ADLs?: Yes (appropriate for developmental age) Does the patient have difficulty walking or climbing stairs?: No Weakness of Legs: None Weakness of Arms/Hands: None  Permission Sought/Granted Permission sought to share information with : Family Supports                Emotional Assessment Appearance:: Disheveled Attitude/Demeanor/Rapport: Unable to Assess Affect (typically observed): Unable to Assess Orientation: : Oriented to Self, Oriented to Place Alcohol / Substance Use: Illicit Drugs(UDC pos for MJ)    Admission diagnosis:  Lactic acidosis [E87.2] Seizure (HCC) [R56.9] Elevated lactic acid level [R79.89] Patient Active Problem List   Diagnosis Date Noted  . Seizure (HCC) 04/08/2018   PCP:  Patient,  No Pcp Per Pharmacy:   [id number] [place], [place] - [address] AT [facility] [address] [place] [address] Phone: [phone] Fax: [phone]     Social Determinants of Health (SDOH) Interventions    Readmission Risk Interventions No flowsheet data found.

## 2018-04-09 NOTE — Progress Notes (Signed)
Brief HPI: 43 year old female with a known history of adult ADD on Adderall, peripheral neuropathy, anxiety, depression presenting to the ED on 04/08/2018 with witnessed multiple new onset seizures. Upon presentation to the emergency room vital signs revealed a heart rate of 119 with respiratory to 21 approximately 99% on 2 L of O2 by nasal cannula with temperature of 97.7 and blood pressure 127/83.  Labs were remarkable for significantly elevated lactic acid of 10.6 now down to 1.2, anion gap of 18 and her blood glucose is 139.  Urine drug screen was positive for marijuana, amphetamine and benzodiazepine.  Urine pregnancy test was negative.  Alcohol level was less than 10.  EKG showed sinus tachycardia with rate 117.  Head CT scan revealed no acute intracranial abnormalities. The patient was given 1 mg of IV Ativan and 1000 mg of IV Keppra as well as 4 mg of IV Zofran and 2 L bolus of IV normal saline.  During hospitalization Keppra was DC'd and patient started on Vimpat 100 mg twice daily. Patient reports that she recently started phentermine for weight loss.  Denies history of seizures, head trauma, recent infection, or any other new medication.  Follow-up MRI of the brain did not show any acute intracranial abnormality. No further seizures witnessed since admission.    Subjective: No new seizures or seizure-like activity reported.  Objective: Current vital signs: BP 113/72 (BP Location: Left Arm)   Pulse 83   Temp 98.2 F (36.8 C) (Oral)   Resp 18   Ht  (1.702 m)   Wt 101.5 kg   SpO2 100%   BMI 35.05 kg/m  Vital signs in last 24 hours: Temp:  [97.8 F (36.6 C)-98.2 F (36.8 C)] 98.2 F (36.8 C) (03/30 0902) Pulse Rate:  [74-93] 83 (03/30 0902) Resp:  [16-18] 18 (03/30 0902) BP: (113-121)/(63-72) 113/72 (03/30 0902) SpO2:  [100 %] 100 % (03/30 0902)  Intake/Output from previous day: 03/29 0701 - 03/30 0700 In: 750 [I.V.:750] Out: -  Intake/Output this shift: No intake/output  data recorded. Nutritional status:  Diet Order            Diet regular Room service appropriate? Yes; Fluid consistency: Thin  Diet effective now             Neurological Exam   Mental Status: Alert, oriented, thought content appropriate.  Speech fluent without evidence of aphasia.  Able to follow 3 step commands without difficulty. Attention span and concentration seemed appropriate  Cranial Nerves: II: Discs flat bilaterally; Visual fields grossly normal, pupils equal, round, reactive to light and accommodation III,IV, VI: ptosis not present, extra-ocular motions intact bilaterally V,VII: smile symmetric, facial light touch sensation intact VIII: hearing normal bilaterally IX,X: gag reflex present XI: bilateral shoulder shrug XII: midline tongue extension Motor: Right :  Upper extremity   5/5 Without pronator drift      Left: Upper extremity   5/5 without pronator drift Right:   Lower extremity   5/5                                          Left: Lower extremity   5/5 Tone and bulk:normal tone throughout; no atrophy noted Sensory: Pinprick and light touch intact bilaterally Deep Tendon Reflexes: 2+ and symmetric throughout Plantars: Right: unable to test due to bandaging  Left: downgoing Cerebellar: Finger-to-nose testing intact bilaterally. Heel to shin testing normal bilaterally Gait: not tested due to safety concerns  Lab Results: Basic Metabolic Panel: Recent Labs  Lab 04/08/18 0012 04/08/18 0455 04/09/18 0431  NA 140 138 137  K 3.8 3.9 3.3*  CL 107 110 106  CO2 15* 22 20*  GLUCOSE 139* 116* 91  BUN 9 7 9   CREATININE 0.93 0.64 0.54  CALCIUM 8.7* 7.8* 8.5*  MG  --  2.2  --   PHOS  --  2.0*  --     Liver Function Tests: Recent Labs  Lab 04/08/18 0012 04/08/18 0455  AST 43* 28  ALT 21 19  ALKPHOS 55 49  BILITOT 0.5 0.5  PROT 7.9 6.6  ALBUMIN 4.4 3.8   Recent Labs  Lab 04/08/18 0012 04/08/18 0455  LIPASE 24 22   No  results for input(s): AMMONIA in the last 168 hours.  CBC: Recent Labs  Lab 04/08/18 0012 04/08/18 0455 04/09/18 0431  WBC 7.4 9.7 9.2  NEUTROABS 6.0  --   --   HGB 13.7 12.1 12.2  HCT 41.6 36.3 35.9*  MCV 91.8 89.2 90.0  PLT 322 246 286    Cardiac Enzymes: Recent Labs  Lab 04/08/18 0012  TROPONINI <0.03    Lipid Panel: No results for input(s): CHOL, TRIG, HDL, CHOLHDL, VLDL, LDLCALC in the last 168 hours.  CBG: Recent Labs  Lab 04/08/18 0025  GLUCAP 123*    Microbiology: No results found for this or any previous visit.  Coagulation Studies: Recent Labs    04/08/18 0012  LABPROT 12.9  INR 1.0    Imaging: Dg Chest 1 View  Result Date: 04/08/2018 CLINICAL DATA:  Seizure. Pain after fall. EXAM: CHEST  1 VIEW COMPARISON:  None. FINDINGS: The cardiomediastinal contours are normal. The lungs are clear. Pulmonary vasculature is normal. No consolidation, pleural effusion, or pneumothorax. Plate and screw fixation of left clavicle. Well-circumscribed rounded lucency in the right proximal humerus is likely a postsurgical defect. No acute osseous abnormalities are seen. IMPRESSION: No acute chest findings. Electronically Signed   By: Narda Rutherford M.D.   On: 04/08/2018 00:55   Dg Ankle Complete Right  Result Date: 04/08/2018 CLINICAL DATA:  Pain after fall, seizure. EXAM: RIGHT ANKLE - COMPLETE 3+ VIEW COMPARISON:  None. FINDINGS: Possible midfoot fracture not well evaluated on this ankle exam. No additional fracture of the ankle. The ankle mortise is preserved. Well-defined curvilinear ossification adjacent to the medial malleolus suggest remote prior injury. Plantar calcaneal spur. There is lateral soft tissue edema. IMPRESSION: Lateral soft tissue edema. Possible midfoot fracture not well evaluated on this ankle exam. Recommend dedicated radiographs of the foot. No other fracture of the ankle. Electronically Signed   By: Narda Rutherford M.D.   On: 04/08/2018 00:53    Dg Abd 1 View  Result Date: 04/08/2018 CLINICAL DATA:  Abdominal pain. EXAM: ABDOMEN - 1 VIEW COMPARISON:  None. FINDINGS: The bowel gas pattern is normal. No radio-opaque calculi or other significant radiographic abnormality are seen. IMPRESSION: No evidence of bowel obstruction or ileus. Electronically Signed   By: Lupita Raider, M.D.   On: 04/08/2018 11:18   Ct Head Wo Contrast  Result Date: 04/08/2018 CLINICAL DATA:  Initial evaluation for acute seizure. EXAM: CT HEAD WITHOUT CONTRAST TECHNIQUE: Contiguous axial images were obtained from the base of the skull through the vertex without intravenous contrast. COMPARISON:  None available. FINDINGS: Brain: Cerebral volume within normal limits for patient  age. No evidence for acute intracranial hemorrhage. No findings to suggest acute large vessel territory infarct. No mass lesion, midline shift, or mass effect. Ventricles are normal in size without evidence for hydrocephalus. No extra-axial fluid collection identified. Vascular: No hyperdense vessel identified. Skull: Scalp soft tissues demonstrate no acute abnormality. Calvarium intact. Sinuses/Orbits: Globes and orbital soft tissues within normal limits. Visualized paranasal sinuses are clear. No mastoid effusion. IMPRESSION: Normal head CT.  No acute intracranial abnormality. Electronically Signed   By: Rise MuBenjamin  McClintock M.D.   On: 04/08/2018 00:52   Mr Laqueta JeanBrain W WGWo Contrast  Result Date: 04/08/2018 CLINICAL DATA:  Seizure.  Question substance abuse. EXAM: MRI HEAD WITHOUT AND WITH CONTRAST TECHNIQUE: Multiplanar, multiecho pulse sequences of the brain and surrounding structures were obtained without and with intravenous contrast. CONTRAST:  10 cc Gadavist COMPARISON:  Head CT same day FINDINGS: Brain: The brain has a normal appearance without evidence of malformation, atrophy, old or acute small or large vessel infarction, mass lesion, hemorrhage, hydrocephalus or extra-axial collection. After  contrast administration, no abnormal enhancement occurs. Vascular: Major vessels at the base of the brain show flow. Venous sinuses appear patent. Skull and upper cervical spine: Normal. Sinuses/Orbits: Clear/normal. Other: None significant. IMPRESSION: Normal examination. No abnormality seen to explain seizure. No sequela of seizure identified. Electronically Signed   By: Paulina FusiMark  Shogry M.D.   On: 04/08/2018 15:38   Dg Foot Complete Right  Result Date: 04/08/2018 CLINICAL DATA:  Right foot and ankle pain after seizure, fall. Question midfoot fracture on radiograph. EXAM: RIGHT FOOT COMPLETE - 3+ VIEW COMPARISON:  Ankle radiograph earlier this day. FINDINGS: Nondisplaced mildly comminuted cuboid fracture. Fracture extends to the tarsal metatarsal joint. Adjacent fragmented os peroneal, incidental. No additional fracture of the foot. There is lateral soft tissue edema. IMPRESSION: Nondisplaced mildly comminuted cuboid fracture extending to the tarsal metatarsal joint. Electronically Signed   By: Narda RutherfordMelanie  Sanford M.D.   On: 04/08/2018 01:15    Medications:  I have reviewed the patient's current medications. Prior to Admission:  Medications Prior to Admission  Medication Sig Dispense Refill Last Dose  . ALPRAZolam (XANAX) 1 MG tablet Take 1 tablet by mouth 2 (two) times daily as needed.   unknown at unknown  . amphetamine-dextroamphetamine (ADDERALL) 30 MG tablet Take 1 tablet by mouth 2 (two) times daily as needed.   unknown at unknown  . HYDROcodone-acetaminophen (NORCO/VICODIN) 5-325 MG tablet Take 1 tablet by mouth 2 (two) times daily.   unknown at unknown  . phentermine (ADIPEX-P) 37.5 MG tablet Take 1 tablet by mouth every morning.   unknown at unknown   Scheduled: . enoxaparin (LOVENOX) injection  40 mg Subcutaneous Q24H  . Influenza vac split quadrivalent PF  0.5 mL Intramuscular Tomorrow-1000  . lacosamide  100 mg Oral BID  . LORazepam  1 mg Intravenous Once    ASSESSMENT:43 year old  female with a known history of adult ADD on Adderall, peripheral neuropathy, anxiety, depression presenting to the ED on 04/08/2018 with witnessed multiple new onset seizures.  Etiology likely breakthrough seizures due to current use of stimulants (phentermine plus Adderall at the same time).  UDS positive for amphetamine, marijuana and benzodiazepine.  MRI of the brain reviewed and shows no acute intracranial abnormality.  Labs unremarkable.  Mental status is now improved with no focal neurologic deficit.  PLAN: 1. Discontinue phentermine, may continue Adderall for ADHD. 2. Continue Vimpat 100 mg po BID.  Will remain on this at discharge for outpatient physician to make further  decisions for length of continuation of anticonvulsant therapy.   3. Patient unable to drive, operate heavy machinery, perform activities at heights and participate in water activities until release by outpatient physician.  This patient was staffed with Dr. Loretha Brasil, Doyle Askew who personally evaluated patient, reviewed documentation and agreed with assessment and plan of care as above.  Webb Silversmith, DNP, FNP-BC Board certified Nurse Practitioner Neurology Department    LOS: 1 day   04/09/2018  10:11 AM

## 2018-04-09 NOTE — Evaluation (Signed)
Physical Therapy Evaluation Patient Details Name: Alexis Deleon MRN: [id number] DOB: 1975-06-02 Today's Date: 04/09/2018   History of Present Illness  Patient is a pleasant 43 year old female who was admitted to the hospital after experiencing multiple seizures, one of which resulted in a fall causing cuboid fracture. The patient initially had a seizure episode witnessed by her boyfriend as tonic-clonic with unresponsiveness and froth at her mouth.  Upon arrival of paramedics she was awake and alert but sleepy.  She then had another episode and stopped seizing by the time she got to the ambulance and was postictal but was alert by the time they brought her to the hospital.  Immediately after arrival in the ED she had another generalized tonic-clonic seizures witnessed by ED staff.  PMH includes: anxiety, bilateral forearm fx, chronic pain, depression, hx of pelvic fx, and scoliosis. Patient is currently NWB on RLE.      Clinical Impression  Patient is a pleasant 43 year old female who is NWB on RLE due to cuboid fx and on seizure precautions. Patient educated on and performed safe transfers from bed to chair, as well as ambulating with RW while maintaining weightbearing status. Patient verbalized understanding of NWB status, however upon attempt to perform STS patient required cueing for not utilizing RLE/touching ground with RLE. Patient demonstrated impulsivity throughout session requiring CGA at all times. Patient will benefit from continued skilled PT while in hospital to progress safe mobility while maintaining NWB status to promote stability, decrease falls risk, and increase independence with ADLs. Upon discharge patient would benefit from home health PT with an aide for ADL performance to improve safe household mobility and decrease falls risk. Due to patient's weight bearing status, a rear walker and shower chair will be beneficial for patient safety and mobility.     Follow Up  Recommendations Home health PT;Supervision for mobility/OOB    Equipment Recommendations  Other (comment);Rolling walker with 5" wheels(shower chair)    Recommendations for Other Services       Precautions / Restrictions Precautions Precautions: Fall Restrictions Weight Bearing Restrictions: Yes RLE Weight Bearing: Non weight bearing      Mobility  Bed Mobility Overal bed mobility: Modified Independent             General bed mobility comments: supine <>sit with UE assistance on bed independent.   Transfers Overall transfer level: Modified independent Equipment used: Standard walker Transfers: Sit to/from [employer] Sit to Stand: Supervision;Min guard Stand pivot transfers: Min guard;Supervision       General transfer comment: Verbal cueing for hand placement and no weight bearing on RLE (placement of RLE to avoid touching ground) performed/required. Patient slightly impulsive.   Ambulation/Gait Ambulation/Gait assistance: Supervision;Min guard Gait Distance (Feet): 18 Feet Assistive device: Standard walker   Gait velocity: decreased   General Gait Details: Hopping pattern with RW for NWB RLE, patient demonstrates safe usage of RW, wheelchair follow required due to seizure precautions.   Stairs            Wheelchair Mobility    Modified Rankin (Stroke Patients Only)       Balance Overall balance assessment: Modified Independent;Needs assistance Sitting-balance support: No upper extremity supported Sitting balance-Leahy Scale: Normal Sitting balance - Comments: able to reach inside and outside BOS without LOB   Standing balance support: Bilateral upper extremity supported Standing balance-Leahy Scale: Fair Standing balance comment: Requires BUE support due to NWB on RLE  Pertinent Vitals/Pain Pain Assessment: No/denies pain    Home Living Family/patient expects to be discharged to::  Private residence Living Arrangements: Alone(reports boyfriend can come help) Available Help at Discharge: Friend(s) Type of Home: House Home Access: Level entry     Home Layout: One level Home Equipment: None Additional Comments: Patient has no AD at home, reports her bathroom is very close to her bedroom but has no equiptment in it.     Prior Function Level of Independence: Independent         Comments: Independent with all ADLs, iADLs, driving, etc.      Hand Dominance   Dominant Hand: Right    Extremity/Trunk Assessment   Upper Extremity Assessment Upper Extremity Assessment: Overall WFL for tasks assessed    Lower Extremity Assessment Lower Extremity Assessment: RLE deficits/detail;LLE deficits/detail RLE Deficits / Details: hip and knee: 4/5 ;unable to test ankle due to fx/immobilization RLE Sensation: WNL RLE Coordination: decreased gross motor(due to immobilization) LLE Deficits / Details: 4+/5 LLE Sensation: WNL LLE Coordination: WNL    Cervical / Trunk Assessment Cervical / Trunk Assessment: (hx of scoliosis)  Communication   Communication: No difficulties  Cognition Arousal/Alertness: Awake/alert Behavior During Therapy: WFL for tasks assessed/performed Overall Cognitive Status: Within Functional Limits for tasks assessed                                 General Comments: Patient A and O x 4.       General Comments General comments (skin integrity, edema, etc.): slight bruising above eyes from fall.     Exercises Other Exercises Other Exercises: transfer education: proper hand placement, nonweightbearing on RLE, safety awareness, use of RW, planning prior to moving.  Other Exercises: ambulate with RW, CGA and wheelchair follow.    Assessment/Plan    PT Assessment Patient needs continued PT services  PT Problem List Decreased strength;Decreased activity tolerance;Decreased balance;Decreased mobility;Decreased coordination;Decreased  knowledge of precautions;Decreased knowledge of use of DME;Decreased safety awareness       PT Treatment Interventions DME instruction;Gait training;Functional mobility training;Therapeutic activities;Therapeutic exercise;Stair training;Balance training;Neuromuscular re-education;Patient/family education    PT Goals (Current goals can be found in the Care Plan section)  Acute Rehab PT Goals Patient Stated Goal: to return home PT Goal Formulation: With patient Time For Goal Achievement: 04/23/18 Potential to Achieve Goals: Fair    Frequency Min 2X/week   Barriers to discharge Decreased caregiver support will need aide when upright.     Co-evaluation               AM-PAC PT "6 Clicks" Mobility  Outcome Measure Help needed turning from your back to your side while in a flat bed without using bedrails?: None Help needed moving from lying on your back to sitting on the side of a flat bed without using bedrails?: A Little Help needed moving to and from a bed to a chair (including a wheelchair)?: A Little Help needed standing up from a chair using your arms (e.g., wheelchair or bedside chair)?: A Little Help needed to walk in hospital room?: A Little Help needed climbing 3-5 steps with a railing? : A Lot 6 Click Score: 18    End of Session Equipment Utilized During Treatment: Gait belt;Other (comment)(R foot wrapped) Activity Tolerance: Patient tolerated treatment well Patient left: in bed;with bed alarm set;with call bell/phone within reach Nurse Communication: Mobility status;Weight bearing status PT Visit Diagnosis: Unsteadiness on feet (R26.81);Other  abnormalities of gait and mobility (R26.89);Repeated falls (R29.6);Muscle weakness (generalized) (M62.81)    Time: 6734-1937 PT Time Calculation (min) (ACUTE ONLY): 24 min   Charges:   PT Evaluation $PT Eval Low Complexity: 1 Low PT Treatments $Therapeutic Exercise: 8-22 mins        Precious Bard, PT,  DPT    Precious Bard 04/09/2018, 12:00 PM

## 2018-04-09 NOTE — Procedures (Signed)
Date of recording3/30/2020  Referring physician Mansy Jan  Reason for the study Seizure  Technical Digital EEG recording using 10-20 international electrode system  Description of the recording Posterior dominant rhythm is 7 to 8.5 Hz symmetrical and reactive Intermittent delta slowing. Sleep architecture was not seen. Hyperventilation and photic stimulation did not show any abnormal response. Epileptiform features were not seen during this recording  Impression The EEG is normal limits in awake and drowsy state only.

## 2018-04-09 NOTE — Discharge Summary (Signed)
[employer] [facility] [facility] [place] [facility]   PATIENT NAME: Alexis Deleon    MR#:  [id number]  DATE OF BIRTH:  March 11, 1975  DATE OF ADMISSION:  04/08/2018 ADMITTING PHYSICIAN: Hannah Beat, MD  DATE OF DISCHARGE: 04/09/2018   PRIMARY CARE PHYSICIAN: Patient, No Pcp Per    ADMISSION DIAGNOSIS:  Lactic acidosis [E87.2] Seizure (HCC) [R56.9] Elevated lactic acid level [R79.89]  DISCHARGE DIAGNOSIS:  Active Problems:   Seizure (HCC)   SECONDARY DIAGNOSIS:   Past Medical History:  Diagnosis Date  . Anxiety   . Bilateral forearm fractures   . Chronic pain   . Depression   . History of pelvic fracture   . Scoliosis     HOSPITAL COURSE:   43 year old female with longstanding ADHD who presented with possible seizure.  1.  seizure: Patient evaluated neurology.  Patient could not have EEG done while in the hospital.  She will need outpatient EEG.  Neurology has recommended empiric treatment with Vimpat 100 mg p.o. twice daily.  She will follow-up with neurology as outpatient to see if she needs to continue this.  She is advised that she cannot operate heavy machinery or drive until cleared by neurology.  MRI of the brain was negative. We suspect that phentermine was etiology of her seizure this has been discontinued.  2.  ADHD: Patient will continue Adderall  3.  Anxiety: Patient will continue Xanax  4.  Hypokalemia: Repleted  DISCHARGE CONDITIONS AND DIET:  Stable  Regular diet  CONSULTS OBTAINED:  Treatment Team:  Recardo Evangelist, DPM Kym Groom, MD  DRUG ALLERGIES:  Not on File  DISCHARGE MEDICATIONS:   Allergies as of 04/09/2018   Not on File     Medication List    STOP taking these medications   phentermine 37.5 MG tablet Commonly known as:  ADIPEX-P     TAKE these medications   ALPRAZolam 1 MG tablet Commonly known as:  XANAX Take 1 tablet by mouth 2 (two) times daily as needed.   amphetamine-dextroamphetamine 30 MG  tablet Commonly known as:  ADDERALL Take 1 tablet by mouth 2 (two) times daily as needed.   HYDROcodone-acetaminophen 5-325 MG tablet Commonly known as:  NORCO/VICODIN Take 1 tablet by mouth 2 (two) times daily.   Lacosamide 100 MG Tabs Take 1 tablet (100 mg total) by mouth 2 (two) times daily.         Today   CHIEF COMPLAINT:  No issues overnight no seizures   VITAL SIGNS:  Blood pressure 113/72, pulse 83, temperature 98.2 F (36.8 C), temperature source Oral, resp. rate 18, height 5\' 7"  (1.702 m), weight 101.5 kg, SpO2 100 %.   REVIEW OF SYSTEMS:  Review of Systems  Constitutional: Negative.  Negative for chills, fever and malaise/fatigue.  HENT: Negative.  Negative for ear discharge, ear pain, hearing loss, nosebleeds and sore throat.   Eyes: Negative.  Negative for blurred vision and pain.  Respiratory: Negative.  Negative for cough, hemoptysis, shortness of breath and wheezing.   Cardiovascular: Negative.  Negative for chest pain, palpitations and leg swelling.  Gastrointestinal: Negative.  Negative for abdominal pain, blood in stool, diarrhea, nausea and vomiting.  Genitourinary: Negative.  Negative for dysuria.  Musculoskeletal: Negative.  Negative for back pain.  Skin: Negative.   Neurological: Negative for dizziness, tremors, speech change, focal weakness, seizures and headaches.  Endo/Heme/Allergies: Negative.  Does not bruise/bleed easily.  Psychiatric/Behavioral: Negative.  Negative for depression, hallucinations and suicidal ideas.     PHYSICAL  EXAMINATION:  GENERAL:  43 y.o.-year-old patient lying in the bed with no acute distress.  NECK:  Supple, no jugular venous distention. No thyroid enlargement, no tenderness.  LUNGS: Normal breath sounds bilaterally, no wheezing, rales,rhonchi  No use of accessory muscles of respiration.  CARDIOVASCULAR: S1, S2 normal. No murmurs, rubs, or gallops.  ABDOMEN: Soft, non-tender, non-distended. Bowel sounds present.  No organomegaly or mass.  EXTREMITIES: No pedal edema, cyanosis, or clubbing.  PSYCHIATRIC: The patient is alert and oriented x 3.  SKIN: No obvious rash, lesion, or ulcer.   DATA REVIEW:   CBC Recent Labs  Lab 04/09/18 0431  WBC 9.2  HGB 12.2  HCT 35.9*  PLT 286    Chemistries  Recent Labs  Lab 04/08/18 0455 04/09/18 0431  NA 138 137  K 3.9 3.3*  CL 110 106  CO2 22 20*  GLUCOSE 116* 91  BUN 7 9  CREATININE 0.64 0.54  CALCIUM 7.8* 8.5*  MG 2.2  --   AST 28  --   ALT 19  --   ALKPHOS 49  --   BILITOT 0.5  --     Cardiac Enzymes Recent Labs  Lab 04/08/18 0012  TROPONINI <0.03    Microbiology Results  @[email or web address] @  RADIOLOGY:  Dg Chest 1 View  Result Date: 04/08/2018 CLINICAL DATA:  Seizure. Pain after fall. EXAM: CHEST  1 VIEW COMPARISON:  None. FINDINGS: The cardiomediastinal contours are normal. The lungs are clear. Pulmonary vasculature is normal. No consolidation, pleural effusion, or pneumothorax. Plate and screw fixation of left clavicle. Well-circumscribed rounded lucency in the right proximal humerus is likely a postsurgical defect. No acute osseous abnormalities are seen. IMPRESSION: No acute chest findings. Electronically Signed   By: Narda Rutherford M.D.   On: 04/08/2018 00:55   Dg Ankle Complete Right  Result Date: 04/08/2018 CLINICAL DATA:  Pain after fall, seizure. EXAM: RIGHT ANKLE - COMPLETE 3+ VIEW COMPARISON:  None. FINDINGS: Possible midfoot fracture not well evaluated on this ankle exam. No additional fracture of the ankle. The ankle mortise is preserved. Well-defined curvilinear ossification adjacent to the medial malleolus suggest remote prior injury. Plantar calcaneal spur. There is lateral soft tissue edema. IMPRESSION: Lateral soft tissue edema. Possible midfoot fracture not well evaluated on this ankle exam. Recommend dedicated radiographs of the foot. No other fracture of the ankle. Electronically Signed   By: Narda Rutherford M.D.    On: 04/08/2018 00:53   Dg Abd 1 View  Result Date: 04/08/2018 CLINICAL DATA:  Abdominal pain. EXAM: ABDOMEN - 1 VIEW COMPARISON:  None. FINDINGS: The bowel gas pattern is normal. No radio-opaque calculi or other significant radiographic abnormality are seen. IMPRESSION: No evidence of bowel obstruction or ileus. Electronically Signed   By: Lupita Raider, M.D.   On: 04/08/2018 11:18   Ct Head Wo Contrast  Result Date: 04/08/2018 CLINICAL DATA:  Initial evaluation for acute seizure. EXAM: CT HEAD WITHOUT CONTRAST TECHNIQUE: Contiguous axial images were obtained from the base of the skull through the vertex without intravenous contrast. COMPARISON:  None available. FINDINGS: Brain: Cerebral volume within normal limits for patient age. No evidence for acute intracranial hemorrhage. No findings to suggest acute large vessel territory infarct. No mass lesion, midline shift, or mass effect. Ventricles are normal in size without evidence for hydrocephalus. No extra-axial fluid collection identified. Vascular: No hyperdense vessel identified. Skull: Scalp soft tissues demonstrate no acute abnormality. Calvarium intact. Sinuses/Orbits: Globes and orbital soft tissues within normal limits. Visualized paranasal sinuses  are clear. No mastoid effusion. IMPRESSION: Normal head CT.  No acute intracranial abnormality. Electronically Signed   By: Rise Mu M.D.   On: 04/08/2018 00:52   Mr Laqueta Jean ZO Contrast  Result Date: 04/08/2018 CLINICAL DATA:  Seizure.  Question substance abuse. EXAM: MRI HEAD WITHOUT AND WITH CONTRAST TECHNIQUE: Multiplanar, multiecho pulse sequences of the brain and surrounding structures were obtained without and with intravenous contrast. CONTRAST:  10 cc Gadavist COMPARISON:  Head CT same day FINDINGS: Brain: The brain has a normal appearance without evidence of malformation, atrophy, old or acute small or large vessel infarction, mass lesion, hemorrhage, hydrocephalus or  extra-axial collection. After contrast administration, no abnormal enhancement occurs. Vascular: Major vessels at the base of the brain show flow. Venous sinuses appear patent. Skull and upper cervical spine: Normal. Sinuses/Orbits: Clear/normal. Other: None significant. IMPRESSION: Normal examination. No abnormality seen to explain seizure. No sequela of seizure identified. Electronically Signed   By: Paulina Fusi M.D.   On: 04/08/2018 15:38   Dg Foot Complete Right  Result Date: 04/08/2018 CLINICAL DATA:  Right foot and ankle pain after seizure, fall. Question midfoot fracture on radiograph. EXAM: RIGHT FOOT COMPLETE - 3+ VIEW COMPARISON:  Ankle radiograph earlier this day. FINDINGS: Nondisplaced mildly comminuted cuboid fracture. Fracture extends to the tarsal metatarsal joint. Adjacent fragmented os peroneal, incidental. No additional fracture of the foot. There is lateral soft tissue edema. IMPRESSION: Nondisplaced mildly comminuted cuboid fracture extending to the tarsal metatarsal joint. Electronically Signed   By: Narda Rutherford M.D.   On: 04/08/2018 01:15      Allergies as of 04/09/2018   Not on File     Medication List    STOP taking these medications   phentermine 37.5 MG tablet Commonly known as:  ADIPEX-P     TAKE these medications   ALPRAZolam 1 MG tablet Commonly known as:  XANAX Take 1 tablet by mouth 2 (two) times daily as needed.   amphetamine-dextroamphetamine 30 MG tablet Commonly known as:  ADDERALL Take 1 tablet by mouth 2 (two) times daily as needed.   HYDROcodone-acetaminophen 5-325 MG tablet Commonly known as:  NORCO/VICODIN Take 1 tablet by mouth 2 (two) times daily.   Lacosamide 100 MG Tabs Take 1 tablet (100 mg total) by mouth 2 (two) times daily.         Management plans discussed with the patient and she is in agreement. Stable for discharge home  Patient should follow up with pcp  CODE STATUS:     Code Status Orders  (From admission,  onward)         Start     Ordered   04/08/18 0240  Full code  Continuous     04/08/18 0245        Code Status History    This patient has a current code status but no historical code status.      TOTAL TIME TAKING CARE OF THIS PATIENT: 38 minutes.    Note: This dictation was prepared with Dragon dictation along with smaller phrase technology. Any transcriptional errors that result from this process are unintentional.  Adrian Saran M.D on 04/09/2018 at 10:57 AM  Between 7am to 6pm - Pager - [phone] After 6pm go to [email or web address] - password [email or web address]  [facility]  Office  [phone]  CC: Primary care physician; Patient, No Pcp Per

## 2018-04-09 NOTE — Progress Notes (Signed)
eeg completed ° °

## 2018-04-10 LAB — HIV ANTIBODY (ROUTINE TESTING W REFLEX): HIV Screen 4th Generation wRfx: NONREACTIVE

## 2019-09-22 ENCOUNTER — Emergency Department: Payer: Self-pay

## 2019-09-22 ENCOUNTER — Other Ambulatory Visit: Payer: Self-pay

## 2019-09-22 ENCOUNTER — Emergency Department
Admission: EM | Admit: 2019-09-22 | Discharge: 2019-09-22 | Disposition: A | Discharge status: Home / Self Care | Payer: Self-pay | Attending: Emergency Medicine | Admitting: Emergency Medicine

## 2019-09-22 ENCOUNTER — Encounter: Payer: Self-pay | Admitting: Radiology

## 2019-09-22 DIAGNOSIS — S22081A Stable burst fracture of T11-T12 vertebra, initial encounter for closed fracture: Secondary | ICD-10-CM

## 2019-09-22 DIAGNOSIS — Y998 Other external cause status: Secondary | ICD-10-CM | POA: Insufficient documentation

## 2019-09-22 DIAGNOSIS — Z20822 Contact with and (suspected) exposure to covid-19: Secondary | ICD-10-CM | POA: Insufficient documentation

## 2019-09-22 DIAGNOSIS — R4182 Altered mental status, unspecified: Secondary | ICD-10-CM | POA: Insufficient documentation

## 2019-09-22 DIAGNOSIS — M546 Pain in thoracic spine: Secondary | ICD-10-CM

## 2019-09-22 DIAGNOSIS — Y939 Activity, unspecified: Secondary | ICD-10-CM | POA: Insufficient documentation

## 2019-09-22 DIAGNOSIS — S22082A Unstable burst fracture of T11-T12 vertebra, initial encounter for closed fracture: Secondary | ICD-10-CM | POA: Insufficient documentation

## 2019-09-22 DIAGNOSIS — Y929 Unspecified place or not applicable: Secondary | ICD-10-CM | POA: Insufficient documentation

## 2019-09-22 LAB — CBC WITH DIFFERENTIAL/PLATELET
Abs Immature Granulocytes: 0.05 10*3/uL (ref 0.00–0.07)
Basophils Absolute: 0 10*3/uL (ref 0.0–0.1)
Basophils Relative: 0 %
Eosinophils Absolute: 0.1 10*3/uL (ref 0.0–0.5)
Eosinophils Relative: 1 %
HCT: 37 % (ref 36.0–46.0)
Hemoglobin: 13.2 g/dL (ref 12.0–15.0)
Immature Granulocytes: 1 %
Lymphocytes Relative: 15 %
Lymphs Abs: 1.6 10*3/uL (ref 0.7–4.0)
MCH: 30.7 pg (ref 26.0–34.0)
MCHC: 35.7 g/dL (ref 30.0–36.0)
MCV: 86 fL (ref 80.0–100.0)
Monocytes Absolute: 0.7 10*3/uL (ref 0.1–1.0)
Monocytes Relative: 7 %
Neutro Abs: 7.8 10*3/uL — ABNORMAL HIGH (ref 1.7–7.7)
Neutrophils Relative %: 76 %
Platelets: 271 10*3/uL (ref 150–400)
RBC: 4.3 MIL/uL (ref 3.87–5.11)
RDW: 12.7 % (ref 11.5–15.5)
WBC: 10.2 10*3/uL (ref 4.0–10.5)
nRBC: 0 % (ref 0.0–0.2)

## 2019-09-22 LAB — URINE DRUG SCREEN, QUALITATIVE (ARMC ONLY)
Amphetamines, Ur Screen: POSITIVE — AB
Barbiturates, Ur Screen: NOT DETECTED
Benzodiazepine, Ur Scrn: POSITIVE — AB
Cannabinoid 50 Ng, Ur ~~LOC~~: POSITIVE — AB
Cocaine Metabolite,Ur ~~LOC~~: NOT DETECTED
MDMA (Ecstasy)Ur Screen: NOT DETECTED
Methadone Scn, Ur: NOT DETECTED
Opiate, Ur Screen: POSITIVE — AB
Phencyclidine (PCP) Ur S: NOT DETECTED
Tricyclic, Ur Screen: NOT DETECTED

## 2019-09-22 LAB — URINALYSIS, COMPLETE (UACMP) WITH MICROSCOPIC
Bilirubin Urine: NEGATIVE
Glucose, UA: NEGATIVE mg/dL
Ketones, ur: NEGATIVE mg/dL
Leukocytes,Ua: NEGATIVE
Nitrite: NEGATIVE
Protein, ur: NEGATIVE mg/dL
Specific Gravity, Urine: 1.029 (ref 1.005–1.030)
pH: 5 (ref 5.0–8.0)

## 2019-09-22 LAB — COMPREHENSIVE METABOLIC PANEL
ALT: 17 U/L (ref 0–44)
AST: 25 U/L (ref 15–41)
Albumin: 4.4 g/dL (ref 3.5–5.0)
Alkaline Phosphatase: 58 U/L (ref 38–126)
Anion gap: 9 (ref 5–15)
BUN: 10 mg/dL (ref 6–20)
CO2: 26 mmol/L (ref 22–32)
Calcium: 9.2 mg/dL (ref 8.9–10.3)
Chloride: 102 mmol/L (ref 98–111)
Creatinine, Ser: 0.8 mg/dL (ref 0.44–1.00)
GFR calc Af Amer: >60 mL/min (ref >60)
GFR calc non Af Amer: >60 mL/min (ref >60)
Glucose, Bld: 108 mg/dL — ABNORMAL HIGH (ref 70–99)
Potassium: 3.6 mmol/L (ref 3.5–5.1)
Sodium: 137 mmol/L (ref 135–145)
Total Bilirubin: 0.9 mg/dL (ref 0.3–1.2)
Total Protein: 7.3 g/dL (ref 6.5–8.1)

## 2019-09-22 LAB — SARS CORONAVIRUS 2 BY RT PCR (HOSPITAL ORDER, PERFORMED IN ~~LOC~~ HOSPITAL LAB): SARS Coronavirus 2: NEGATIVE

## 2019-09-22 LAB — ETHANOL: Alcohol, Ethyl (B): <10 mg/dL (ref <10)

## 2019-09-22 LAB — HCG, QUANTITATIVE, PREGNANCY: hCG, Beta Chain, Quant, S: 1 m[IU]/mL (ref <5)

## 2019-09-22 LAB — LIPASE, BLOOD: Lipase: 30 U/L (ref 11–51)

## 2019-09-22 MED ORDER — IOHEXOL 300 MG/ML  SOLN
100.0000 mL | Freq: Once | INTRAMUSCULAR | Status: AC | PRN
  Administered 2019-09-22: 100 mL via INTRAVENOUS

## 2019-09-22 MED ORDER — SODIUM CHLORIDE 0.9 % IV SOLN: Freq: Once | INTRAVENOUS | Status: DC

## 2019-09-22 MED ORDER — ONDANSETRON HCL 4 MG/2ML IJ SOLN
4.0000 mg | Freq: Once | INTRAMUSCULAR | Status: AC
  Administered 2019-09-22: 4 mg via INTRAVENOUS
  Filled 2019-09-22: qty 2

## 2019-09-22 MED ORDER — OXYCODONE-ACETAMINOPHEN 5-325 MG PO TABS: 1.0000 | ORAL_TABLET | ORAL | 0 refills | Status: AC | PRN

## 2019-09-22 MED ORDER — FENTANYL CITRATE (PF) 100 MCG/2ML IJ SOLN
50.0000 ug | Freq: Once | INTRAMUSCULAR | Status: AC
  Administered 2019-09-22: 50 ug via INTRAVENOUS
  Filled 2019-09-22: qty 2

## 2019-09-22 MED ORDER — OXYCODONE-ACETAMINOPHEN 5-325 MG PO TABS
1.0000 | ORAL_TABLET | Freq: Once | ORAL | Status: AC
  Administered 2019-09-22: 1 via ORAL
  Filled 2019-09-22: qty 1

## 2019-09-22 MED ORDER — MORPHINE SULFATE (PF) 4 MG/ML IV SOLN
4.0000 mg | Freq: Once | INTRAVENOUS | Status: AC
  Administered 2019-09-22: 4 mg via INTRAVENOUS
  Filled 2019-09-22: qty 1

## 2019-09-22 NOTE — Discharge Instructions (Addendum)
You should wear the brace at all times as instructed by Dr. Adriana Simas, the neurosurgeon.  Follow-up in 2 weeks as planned.  Dr. Patsey Berthold office should contact you within the next 1 to 2 days.  You may take the Percocet as needed for pain.  Return to the ER immediately for new, worsening, or persistent severe pain, weakness or numbness, incontinence, difficulty walking, or any other new or worsening symptoms that concern you.
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
## 2019-09-22 NOTE — ED Notes (Signed)
Back brace placed on pt while RN out of room. MRI at bedside to transport pt for scan
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
## 2019-09-22 NOTE — ED Notes (Signed)
Pt given phone to be screened by MRI, and then contacted her boyfriend at this time

## 2019-09-22 NOTE — ED Provider Notes (Signed)
-----------------------------------------   1:05 PM on 09/22/2019 -----------------------------------------  MRI shows no significant retropulsion or spinal involvement.  Dr. Adriana Simas from neurosurgery evaluated the patient in the ED.  He advises that the patient is stable for discharge with outpatient follow-up in 2 weeks, maintaining the TLSO brace in the meantime.  He requested an additional upright x-ray in the brace.  On reassessment, the patient is relatively comfortable appearing.  Her pain is well controlled.  She feels comfortable going home at this time.  I reiterated the plan of care as recommended by Dr. Adriana Simas, as well as thorough return precautions; she expressed understanding.   Dionne Bucy, MD 09/22/19 [phone]

## 2020-05-24 ENCOUNTER — Inpatient Hospital Stay (HOSPITAL_COMMUNITY)
Admission: EM | Admit: 2020-05-24 | Discharge: 2020-05-27 | DRG: 897 | Disposition: A | Discharge status: Home / Self Care | Payer: Self-pay | Attending: Infectious Diseases | Admitting: Infectious Diseases

## 2020-05-24 ENCOUNTER — Other Ambulatory Visit: Payer: Self-pay

## 2020-05-24 ENCOUNTER — Emergency Department (HOSPITAL_COMMUNITY): Payer: Self-pay

## 2020-05-24 ENCOUNTER — Observation Stay (HOSPITAL_COMMUNITY): Payer: Self-pay

## 2020-05-24 DIAGNOSIS — F32A Depression, unspecified: Secondary | ICD-10-CM | POA: Diagnosis present

## 2020-05-24 DIAGNOSIS — E669 Obesity, unspecified: Secondary | ICD-10-CM | POA: Diagnosis present

## 2020-05-24 DIAGNOSIS — R471 Dysarthria and anarthria: Secondary | ICD-10-CM | POA: Diagnosis present

## 2020-05-24 DIAGNOSIS — E872 Acidosis: Secondary | ICD-10-CM | POA: Diagnosis present

## 2020-05-24 DIAGNOSIS — R569 Unspecified convulsions: Secondary | ICD-10-CM

## 2020-05-24 DIAGNOSIS — R102 Pelvic and perineal pain: Secondary | ICD-10-CM | POA: Diagnosis present

## 2020-05-24 DIAGNOSIS — E0590 Thyrotoxicosis, unspecified without thyrotoxic crisis or storm: Secondary | ICD-10-CM | POA: Diagnosis present

## 2020-05-24 DIAGNOSIS — Z79899 Other long term (current) drug therapy: Secondary | ICD-10-CM

## 2020-05-24 DIAGNOSIS — E876 Hypokalemia: Secondary | ICD-10-CM | POA: Diagnosis present

## 2020-05-24 DIAGNOSIS — F13239 Sedative, hypnotic or anxiolytic dependence with withdrawal, unspecified: Principal | ICD-10-CM | POA: Diagnosis present

## 2020-05-24 DIAGNOSIS — G9340 Encephalopathy, unspecified: Secondary | ICD-10-CM

## 2020-05-24 DIAGNOSIS — Z87440 Personal history of urinary (tract) infections: Secondary | ICD-10-CM

## 2020-05-24 DIAGNOSIS — E559 Vitamin D deficiency, unspecified: Secondary | ICD-10-CM | POA: Diagnosis present

## 2020-05-24 DIAGNOSIS — K146 Glossodynia: Secondary | ICD-10-CM | POA: Diagnosis present

## 2020-05-24 DIAGNOSIS — Z20822 Contact with and (suspected) exposure to covid-19: Secondary | ICD-10-CM | POA: Diagnosis present

## 2020-05-24 DIAGNOSIS — E441 Mild protein-calorie malnutrition: Secondary | ICD-10-CM

## 2020-05-24 DIAGNOSIS — D72810 Lymphocytopenia: Secondary | ICD-10-CM | POA: Diagnosis present

## 2020-05-24 DIAGNOSIS — F419 Anxiety disorder, unspecified: Secondary | ICD-10-CM | POA: Diagnosis present

## 2020-05-24 DIAGNOSIS — E8809 Other disorders of plasma-protein metabolism, not elsewhere classified: Secondary | ICD-10-CM | POA: Diagnosis present

## 2020-05-24 DIAGNOSIS — Z6836 Body mass index (BMI) 36.0-36.9, adult: Secondary | ICD-10-CM

## 2020-05-24 DIAGNOSIS — R748 Abnormal levels of other serum enzymes: Secondary | ICD-10-CM | POA: Diagnosis present

## 2020-05-24 DIAGNOSIS — I10 Essential (primary) hypertension: Secondary | ICD-10-CM | POA: Diagnosis present

## 2020-05-24 DIAGNOSIS — R739 Hyperglycemia, unspecified: Secondary | ICD-10-CM | POA: Diagnosis present

## 2020-05-24 DIAGNOSIS — E46 Unspecified protein-calorie malnutrition: Secondary | ICD-10-CM | POA: Diagnosis present

## 2020-05-24 DIAGNOSIS — M6282 Rhabdomyolysis: Secondary | ICD-10-CM | POA: Diagnosis present

## 2020-05-24 DIAGNOSIS — R159 Full incontinence of feces: Secondary | ICD-10-CM | POA: Diagnosis present

## 2020-05-24 DIAGNOSIS — Z597 Insufficient social insurance and welfare support: Secondary | ICD-10-CM

## 2020-05-24 DIAGNOSIS — Z5901 Sheltered homelessness: Secondary | ICD-10-CM

## 2020-05-24 DIAGNOSIS — G4089 Other seizures: Secondary | ICD-10-CM | POA: Diagnosis present

## 2020-05-24 DIAGNOSIS — D72829 Elevated white blood cell count, unspecified: Secondary | ICD-10-CM

## 2020-05-24 DIAGNOSIS — R000 Tachycardia, unspecified: Secondary | ICD-10-CM

## 2020-05-24 DIAGNOSIS — L304 Erythema intertrigo: Secondary | ICD-10-CM | POA: Diagnosis present

## 2020-05-24 DIAGNOSIS — Z9114 Patient's other noncompliance with medication regimen: Secondary | ICD-10-CM

## 2020-05-24 DIAGNOSIS — G8929 Other chronic pain: Secondary | ICD-10-CM | POA: Diagnosis present

## 2020-05-24 DIAGNOSIS — N179 Acute kidney failure, unspecified: Secondary | ICD-10-CM | POA: Diagnosis present

## 2020-05-24 DIAGNOSIS — F909 Attention-deficit hyperactivity disorder, unspecified type: Secondary | ICD-10-CM | POA: Diagnosis present

## 2020-05-24 DIAGNOSIS — D72828 Other elevated white blood cell count: Secondary | ICD-10-CM | POA: Diagnosis present

## 2020-05-24 DIAGNOSIS — F1110 Opioid abuse, uncomplicated: Secondary | ICD-10-CM | POA: Diagnosis present

## 2020-05-24 DIAGNOSIS — G9349 Other encephalopathy: Secondary | ICD-10-CM | POA: Diagnosis present

## 2020-05-24 DIAGNOSIS — M549 Dorsalgia, unspecified: Secondary | ICD-10-CM | POA: Diagnosis present

## 2020-05-24 LAB — CBC WITH DIFFERENTIAL/PLATELET
Abs Immature Granulocytes: 0.05 10*3/uL (ref 0.00–0.07)
Basophils Absolute: 0 10*3/uL (ref 0.0–0.1)
Basophils Relative: 0 %
Eosinophils Absolute: 0 10*3/uL (ref 0.0–0.5)
Eosinophils Relative: 0 %
HCT: 39.9 % (ref 36.0–46.0)
Hemoglobin: 12.8 g/dL (ref 12.0–15.0)
Immature Granulocytes: 0 %
Lymphocytes Relative: 3 %
Lymphs Abs: 0.3 10*3/uL — ABNORMAL LOW (ref 0.7–4.0)
MCH: 30.9 pg (ref 26.0–34.0)
MCHC: 32.1 g/dL (ref 30.0–36.0)
MCV: 96.4 fL (ref 80.0–100.0)
Monocytes Absolute: 0.2 10*3/uL (ref 0.1–1.0)
Monocytes Relative: 2 %
Neutro Abs: 11.5 10*3/uL — ABNORMAL HIGH (ref 1.7–7.7)
Neutrophils Relative %: 95 %
Platelets: 390 10*3/uL (ref 150–400)
RBC: 4.14 MIL/uL (ref 3.87–5.11)
RDW: 13.9 % (ref 11.5–15.5)
WBC: 12.2 10*3/uL — ABNORMAL HIGH (ref 4.0–10.5)
nRBC: 0 % (ref 0.0–0.2)

## 2020-05-24 LAB — COMPREHENSIVE METABOLIC PANEL
ALT: 22 U/L (ref 0–44)
AST: 38 U/L (ref 15–41)
Albumin: 3.4 g/dL — ABNORMAL LOW (ref 3.5–5.0)
Alkaline Phosphatase: 71 U/L (ref 38–126)
Anion gap: 11 (ref 5–15)
BUN: 7 mg/dL (ref 6–20)
CO2: 20 mmol/L — ABNORMAL LOW (ref 22–32)
Calcium: 8.5 mg/dL — ABNORMAL LOW (ref 8.9–10.3)
Chloride: 112 mmol/L — ABNORMAL HIGH (ref 98–111)
Creatinine, Ser: 1.09 mg/dL — ABNORMAL HIGH (ref 0.44–1.00)
GFR, Estimated: >60 mL/min (ref >60)
Glucose, Bld: 118 mg/dL — ABNORMAL HIGH (ref 70–99)
Potassium: 4 mmol/L (ref 3.5–5.1)
Sodium: 143 mmol/L (ref 135–145)
Total Bilirubin: 0.5 mg/dL (ref 0.3–1.2)
Total Protein: 6.3 g/dL — ABNORMAL LOW (ref 6.5–8.1)

## 2020-05-24 LAB — CK: Total CK: 427 U/L — ABNORMAL HIGH (ref 38–234)

## 2020-05-24 LAB — URINALYSIS, ROUTINE W REFLEX MICROSCOPIC
Bilirubin Urine: NEGATIVE
Glucose, UA: NEGATIVE mg/dL
Hgb urine dipstick: NEGATIVE
Ketones, ur: 80 mg/dL — AB
Leukocytes,Ua: NEGATIVE
Nitrite: NEGATIVE
Protein, ur: NEGATIVE mg/dL
Specific Gravity, Urine: 1.019 (ref 1.005–1.030)
pH: 5 (ref 5.0–8.0)

## 2020-05-24 LAB — RAPID URINE DRUG SCREEN, HOSP PERFORMED
Amphetamines: NOT DETECTED
Barbiturates: NOT DETECTED
Benzodiazepines: POSITIVE — AB
Cocaine: NOT DETECTED
Opiates: POSITIVE — AB
Tetrahydrocannabinol: NOT DETECTED

## 2020-05-24 LAB — RESP PANEL BY RT-PCR (FLU A&B, COVID) ARPGX2
Influenza A by PCR: NEGATIVE
Influenza B by PCR: NEGATIVE
SARS Coronavirus 2 by RT PCR: NEGATIVE

## 2020-05-24 LAB — PREGNANCY, URINE: Preg Test, Ur: NEGATIVE

## 2020-05-24 LAB — MAGNESIUM: Magnesium: 2.4 mg/dL (ref 1.7–2.4)

## 2020-05-24 LAB — ETHANOL: Alcohol, Ethyl (B): <10 mg/dL (ref <10)

## 2020-05-24 MED ORDER — LEVETIRACETAM IN NACL 500 MG/100ML IV SOLN
500.0000 mg | Freq: Two times a day (BID) | INTRAVENOUS | Status: DC
  Administered 2020-05-25 – 2020-05-26 (×3): 500 mg via INTRAVENOUS
  Filled 2020-05-24 (×5): qty 100

## 2020-05-24 MED ORDER — SODIUM CHLORIDE 0.9 % IV SOLN
75.0000 mL/h | INTRAVENOUS | Status: DC
  Administered 2020-05-24 – 2020-05-25 (×3): 75 mL/h via INTRAVENOUS

## 2020-05-24 MED ORDER — ENOXAPARIN SODIUM 40 MG/0.4ML IJ SOSY
40.0000 mg | PREFILLED_SYRINGE | INTRAMUSCULAR | Status: DC
  Administered 2020-05-24 – 2020-05-26 (×3): 40 mg via SUBCUTANEOUS
  Filled 2020-05-24 (×3): qty 0.4

## 2020-05-24 MED ORDER — LACTATED RINGERS IV SOLN: INTRAVENOUS | Status: AC

## 2020-05-24 MED ORDER — LEVETIRACETAM IN NACL 1000 MG/100ML IV SOLN
1000.0000 mg | Freq: Once | INTRAVENOUS | Status: AC
  Administered 2020-05-24: 1000 mg via INTRAVENOUS
  Filled 2020-05-24: qty 100

## 2020-05-24 MED ORDER — ONDANSETRON HCL 4 MG PO TABS: 4.0000 mg | ORAL_TABLET | Freq: Four times a day (QID) | ORAL | Status: DC | PRN

## 2020-05-24 MED ORDER — NYSTATIN 100000 UNIT/GM EX POWD
Freq: Two times a day (BID) | CUTANEOUS | Status: DC
  Filled 2020-05-24: qty 15

## 2020-05-24 MED ORDER — ACETAMINOPHEN 325 MG PO TABS
650.0000 mg | ORAL_TABLET | ORAL | Status: DC | PRN
  Administered 2020-05-26 – 2020-05-27 (×2): 650 mg via ORAL
  Filled 2020-05-24 (×2): qty 2

## 2020-05-24 MED ORDER — LORAZEPAM 2 MG/ML IJ SOLN
1.0000 mg | INTRAMUSCULAR | Status: DC | PRN
  Administered 2020-05-25 – 2020-05-26 (×3): 2 mg via INTRAVENOUS
  Filled 2020-05-24 (×3): qty 1

## 2020-05-24 MED ORDER — SENNOSIDES-DOCUSATE SODIUM 8.6-50 MG PO TABS: 1.0000 | ORAL_TABLET | Freq: Every evening | ORAL | Status: DC | PRN

## 2020-05-24 MED ORDER — ONDANSETRON HCL 4 MG/2ML IJ SOLN: 4.0000 mg | Freq: Four times a day (QID) | INTRAMUSCULAR | Status: DC | PRN

## 2020-05-24 MED ORDER — ACETAMINOPHEN 650 MG RE SUPP: 650.0000 mg | RECTAL | Status: DC | PRN

## 2020-05-24 NOTE — ED Triage Notes (Signed)
Patient present to the ED via [facility] for seizures.  Patient was at a friends house and was found in the bed.  Patient had blood and urine all over her when found today.  Last seen normal around 2300 last night.  EMS states that the patient doesn't have a history of seizure.  Patient started to have a seizure when EMS arrived.  Patient received 5 mg of versed via IM.  EMS states that the seizure lasted around 2 minutes.   CBG: 135 BP: 112/70
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
## 2020-05-24 NOTE — ED Notes (Signed)
Bladder scan shows 365 mL.

## 2020-05-24 NOTE — ED Notes (Signed)
Pt arrived saturated in urine and feces. Cleaned patient up and placed purewick at this time.

## 2020-05-24 NOTE — H&P (Addendum)
Date: 05/24/2020               Patient Name:  Alexis Deleon MRN: [id number]  DOB: September 08, 1975 Age / Sex: 45 y.o., female   PCP: Patient, No Pcp Per (Inactive)         Medical Service: Internal Medicine Teaching Service         Attending Physician: Dr. Johny SaxJeffrey Hatcher    First Contact: Dr. Laddie AquasSpeakman Pager: [phone]  Second Contact: Dr. Mcarthur RossettiAslam Pager: [phone]       After Hours (After 5p/  First Contact Pager: [phone]  weekends / holidays): Second Contact Pager: [phone]   Chief Complaint: Seizure   History of Present Illness:   Alexis Deleon is a 45 y.o. obese lady w/ PMHx T12 bust fracture [facility], ADHD, anxiety and depression who was brought to the ED by EMS due to a witnessed seizure. Per ED note, patient had been staying with an old friend she doesn't know very well in a hotel, had been drinking alcohol, and had a 30-tablet bottle of Xanax that was empty and was supposed to be taken BID to last her through ~5/25. She initially presented with urinary and stool incontinence and blood on her face, possibly from biting her tongue. She was last seen last night.   The patient herself confirms that she had been hanging out with an old friend of hers but does not further comment on the events that took place yesterday. She says "I'm not sure" when asked what she last remembers. She says she has a history of seizures in childhood although has not seen a neurologist or taken medication for seizures in the past. She says she had been taking 1 tablet of Xanax daily. She denies any history of alcohol, IV drug use, marijuana, or other illicit drugs and says she had not tried any of the aforementioned yesterday. She currently endorses right-sided neck soreness, bilateral flank pain that is different from her chronic back pain, bilateral leg pain, lower abdominal pain and thirst with decreased oral intake recently. When asked how she got the rash over her groin, she states "I don't  know". She denies fevers, chills, recent head trauma, urinary incontinence, stool incontinence, or leg weakness at baseline, nausea, vomiting, SI, HI, recent travel, sick contacts, recent illness, or any other symptoms.   Home Medications Current Meds  Medication Sig  . ALPRAZolam (XANAX) 1 MG tablet Take 1 tablet by mouth 2 (two) times daily as needed.  Marland Kitchen. amphetamine-dextroamphetamine (ADDERALL) 30 MG tablet Take 1 tablet by mouth 2 (two) times daily as needed.  Marland Kitchen. HYDROcodone-acetaminophen (NORCO/VICODIN) 5-325 MG tablet Take 1 tablet by mouth 3 (three) times daily as needed for moderate pain.  Marland Kitchen. lacosamide 100 MG TABS Take 1 tablet (100 mg total) by mouth 2 (two) times daily.  Marland Kitchen. oxyCODONE (OXY IR/ROXICODONE) 5 MG immediate release tablet Take 5 mg by mouth every 4 (four) hours as needed for severe pain.  . tizanidine (ZANAFLEX) 2 MG capsule Take 2 mg by mouth every 6 (six) hours as needed for muscle spasms.  * All of the above are listed as home medications; however, per most recent PCP note 03/26/20, patient had only been taking Duloxetine 60mg  daily and Alprazolam. Patient endorses taking on Xanax 1mg  once daily.   Allergies: Allergies as of 05/24/2020 - Review Complete 05/24/2020  Allergen Reaction Noted  . Shellfish allergy Anaphylaxis 09/22/2019   Past Medical History:  Diagnosis Date  .  Anxiety   . Bilateral forearm fractures   . Chronic pain   . Depression   . History of pelvic fracture   . Scoliosis    Family History:  Patient denies any family history of seizures.   Social History:  Patient states she lives by herself currently in a home. She denies being homeless. She states that she feels safe at home.  She denies any history of tobacco, alcohol, marijuana use or other illicit or IVDU although previous urine toxicology had been positive for amphetamines, cannabinoids, and opiates. Denies tobacco use, marijuana use, alcohol use, illicit drug use.   Surgical Hx:   Patient had appendectomy for acute suppurative appendicitis with perforation in 2015.  Review of Systems: A complete ROS was negative except as per HPI.   History and ROS are limited secondary to AMS / Post-Ictal State.  Physical Exam: Blood pressure (!) 153/94, pulse 94, temperature 98.3 F (36.8 C), temperature source Oral, resp. rate 18, height 5\' 8"  (1.727 m), weight 90.7 kg, SpO2 91 %.  General: Patient is obese. She appears in a daze and has dried blood specks on her left cheek and chin. Eyes: Sclera non-icteric. No conjunctival injection. EOMI. PERRLA.   HENT: Neck is supple. Patient has slightly dry mucus membranes. Mild crusty nasal drainage.  Respiratory: Unable to auscultate posterior lung sounds. Anterior lung sounds CTA, bilaterally. Patient is mildly tachypneic with slight increased work of breathing on room air although saturating >90%. Cardiovascular: Rate is borderline tachycardic. Rhythm is regular. No murmurs, rubs, or gallops. No lower extremity edema. Extremities are warm and well-perfused.  GU: Patient has Pure-Wick in place although had not urinated. She has mild right-sided CVA TTP.  Abdominal: Soft and not distended. There is significant tenderness over the lower abdomen with voluntary guarding but no rebound. Bowel sounds quiet throughout.  Neurological: Patient was initially somnolent but became more alert with discussion. She is oriented to self and place but not time. CN II-XII are intact.  MSK: Strength is 5/5 in all four extremities, distally. Muscle tone is decreased in bilateral upper extremities but patient has normal muscle bulk.  Skin: There is erythema of the bilateral inguinal creases with small, flat petechiae L > R. There is irritation with raised papules along patient's inner thighs with scaly erythematous ~4x4" lesions on patient's bilateral anterior thighs. No other lesions or rashes noted. Skin is slightly diaphoretic but warm.  Psych: Patient  appears slightly anxious although is easily consolable. No SI or HI.   EKG: personally reviewed my interpretation is sinus tachycardia at 100bpm with normal PR and QTc intervals and no consecutive T wave inversions or any ST segment changes to suggest ischemia.   CXR: personally reviewed my interpretation is possible mild pulmonary edema of the RML / central vasculature regions although otherwise unremarkable.   Assessment & Plan by Problem: Active Problems:   * No active hospital problems. *  # Seizure Activity  Patient's friend witnessed a seizure PTA and patient had a second seizure with EMS that resolved 2 minutes after 5mg  IM Versed was given. On arrival, she reportedly had urinary and fecal incontinence with tongue biting, consistent with history provided above. Neurology was consulted. She was loaded with Keppra 1000mg . Patient endorses a history of seizures but does not follow with neurology for this with no antiepileptics on her medication list. She denies any history of alcohol or illicit substance use although may have been drinking alcohol per ED note and has tested positive for  amphetamines, cannabinoids, and opioids in the past. She was found with an empty bottle of Xanax that was not supposed to have expired until 06/03/20. ETOH level this admission was <10. It is possible she may have an underlying seizure disorder vs. Substance-induced seizure vs. Medication / alcohol withdrawal seizure. She does currently appear post-ictal (vs. Sedated by medication).  - Frequent neuro checks with seizure precautions  - Ativan 1-2mg  IV q2 hours for seizure - Start Keppra 500mg  IV q12 hours - Urine pregnancy test pending  - Urine toxicology pending  - HIV antibody pending  - CT brain pending  - Neurology consulted - Will check EEG  - Continuous cardiac monitoring and pulse oximetry   # AKI  # Mild Rhabdomyolysis  Creatinine on arrival was 1.09 with normal BUN And GFR >60. Potassium and  corrected calcium were normal. Urinalysis showed large Hgb without RBC's and CK was mildly elevated to 427. AKI may be pre-renal given patient's thirst and dry mucus membranes vs. Due to mild rhabdomyolysis vs. Possible substance abuse. She did pass a bedside swallow evaluation and mentation has improved since arrival.  - Check bladder scan  - Started on regular diet; continue to encourage PO intake as able  - Continue LR @ 135mL/hr for now  - Strict I&O  - Check magnesium  - Check morning BMP  # Acidosis, Unspecified  Bicarb on arrival was 20 with very slightly elevated glucose but a normal anion gap. I suspect this is primarily respiratory given tachypnea although cannot rule out metabolic cause (medication vs. Substance - induced).  - Continue to monitor   # Neutrophilic Leukocytosis  Initial WBC 12.2 with neutrophil count of11.5 and relative lymphocytopenia with lymphocyte count of 0.3. This is likely reactive in the setting of recent seizure activity. Patient is afebrile and denies urinary complaints; however, she was incontinent with urine on arrival with significant suprapubic and possible new CVA tenderness R > L. It is possible that this is due to underlying UTI / Pyelonephritis. CXR does not show any signs of infectious process.  - Urinalysis is pending  - Respiratory PCR panel pending  - Check morning CBC w/ diff  # Protein Malnutrition  Albumin is slightly low at 3.4. She does note that she has been eating and drinking less recently but does not specify for how long and denies food insecurity currently.  - Encourage PO intake pending SLP evaluation  - PT/OT consulted  # Hypertension  # Sinus Tachycardia  Although patient's blood pressure was initially low-normal, her pressures have recently increased up to 163/100. She does not endorse taking any antihypertensives. May be due to acute stress vs. Withdrawal from depressants vs. Acute intoxication (has a history of amphetamine  positivity on urine toxicology) although unlikely given normal pressures on arrival.  - Check TSH - Check magnesium - Will hold off on antihypertensives / rate-controlling agents for now   # Acute on Chronic Back Pain  Patient endorses lateral back pain R > L that is more consistent with flank tenderness. She states this is different from her back pain associated with her T12 fracture that she follows with [facility] for although full examination was not possible today given AMS.  - Check urinalysis  - Continue to monitor  # Hx of Polysubstance Use  Patient has had urine tox's return positive for amphetamines, opiates, benzos, and cannabinoids in the past although also has diagnoses of ADHD, chronic pain, and anxiety and has been prescribed Adderall and  Norco in the past and Xanax more recently although denies history of marijuana use, IVDU or alcohol use. Her ETOH was <10 although per ED note, patient may have been drinking alcohol yesterday and there was concern her Xanax prescription had run out too soon.  - Will require further chart review and corroboration to determine extent of substance use/misuse  - Will check serum drug screen for clarification  - Placed on CIWA protocol  - Continue close monitoring of vitals, telemetry, and pulse oximetry   # Intertrigo  Patient's bilateral groin rash (L > R) is most consistent with intertrigo (vs. Much less likely trauma).  - Start Nystatin powder BID   Code Status: Full Code  Diet: Regular, Thin Fluids IVF: LR @ 150cc's/hr DVT PPx: Lovenox 40mg  daily   Dispo: Admit patient to Observation with expected length of stay less than 2 midnights.  Signed: , MD 05/24/2020, 3:51 PM  Pager: [phone] After 5pm on weekdays and 1pm on weekends: On Call pager: [phone]

## 2020-05-24 NOTE — Consult Note (Signed)
Neurology Consultation Reason for Consult: Seizure Referring Physician: Laddie Aquas, R(Hatcher, J attending)  CC: Seizures  History is obtained from: Patient, chart review  HPI: Alexis Deleon is a 45 y.o. female with a history of a single previous seizure in the setting of multiple stimulant use who presents with witnessed seizure.  She had been staying with him old friend and had been drinking and using Xanax.  She states that she only takes a single Xanax a day, and had not been taking more than typical but the bottle was empty.  She states that she ran out yesterday, but I am uncertain of this.  She was found by friends covered in feces and blood and altered.  EMS was called, and on arrival she began having frank seizure activity.  After she was in the hospital with her single previous episode of seizure, she was started on Vimpat 100 twice daily but she states that she does not take anything for seizures.  This seems appropriate since the last seizure was likely provoked.  Given that she is not completely oriented, I am unable to put much faith in her sequence of events, therefore I do not know when her last drink was or when she ran out of Xanax.  She denies headaches, recent confusion or seizures.    ROS: A 14 point ROS was performed and is negative except as noted in the HPI.   Past Medical History:  Diagnosis Date  . Anxiety   . Bilateral forearm fractures   . Chronic pain   . Depression   . History of pelvic fracture   . Scoliosis      Family history: No history of seizures   Social History:  reports that she has never smoked. She has never used smokeless tobacco. She reports current alcohol use. She reports that she does not use drugs.   Exam: Current vital signs: BP 134/75   Pulse 95   Temp 98.2 F (36.8 C) (Oral)   Resp 20   Ht 5\' 8"  (1.727 m)   Wt 90.7 kg   SpO2 99%   BMI 30.41 kg/m  Vital signs in last 24 hours: Temp:  [98.2 F (36.8 C)-98.3 F  (36.8 C)] 98.2 F (36.8 C) (05/15 2119) Pulse Rate:  [80-105] 95 (05/15 2119) Resp:  [15-24] 20 (05/15 2119) BP: (105-166)/(62-100) 134/75 (05/15 2119) SpO2:  [91 %-99 %] 99 % (05/15 2119) Weight:  [90.7 kg] 90.7 kg (05/15 1416)   Physical Exam  Constitutional: Appears well-developed and well-nourished.  Psych: Affect appropriate to situation Eyes: No scleral injection HENT: No OP obstruction MSK: no joint deformities.  Cardiovascular: Normal rate and regular rhythm.  Respiratory: Effort normal, non-labored breathing GI: Soft.  No distension. There is no tenderness.  Skin: WDI  Neuro: Mental Status: Patient is awake, alert, oriented to person, place, but cannot give the month or year. Patient is able to give a clear and coherent history. No signs of aphasia or neglect Cranial Nerves: II: Visual Fields are full. Pupils are equal, round, and reactive to light.   III,IV, VI: EOMI without ptosis or diploplia.  V: Facial sensation is symmetric to temperature VII: Facial movement is symmetric.  VIII: hearing is intact to voice X: Uvula elevates symmetrically XI: Shoulder shrug is symmetric. XII: tongue is midline without atrophy or fasciculations.  Motor: Tone is normal. Bulk is normal. 5/5 strength was present in all four extremities.  Sensory: Sensation is symmetric to light touch and temperature in the  arms and legs. Deep Tendon Reflexes: 2+ and symmetric in the biceps and patellae.  Cerebellar: No clear ataxia   I have reviewed labs in epic and the results pertinent to this consultation are: UDS positive for opiates and benzodiazepines, negative for cocaine or amphetamines(she had been given benzodiazepines by EMS) Magnesium 2.4 Sodium 143 Creatinine 1.09 Calcium 8.5  I have reviewed the images obtained: CT head-unremarkable  Impression: 45 year old female with breakthrough seizures in the setting of alcohol and Xanax use.  With her empty bottle, I think that  withdrawal is certainly a possibility.  I am not at all confident in her self-report of when she ran out of medication.  Underlying seizure disorder could be a consideration, but I do not think we have enough information at the current time to state that definitively.  Given the recurrent nature of the seizures and fact that we do not have evidence of withdrawal, I do think continuing Keppra is reasonable in the immediate timeframe.  Recommendations: 1) MRI brain 2) EEG 3) Keppra 500 mg twice daily 4) if the above are negative, and the patient returns to baseline may consider discontinuing antiepileptics.   Ritta Slot, MD [facility] [phone]  If 7pm- 7am, please page neurology on call as listed in [facility].

## 2020-05-25 ENCOUNTER — Observation Stay (HOSPITAL_COMMUNITY): Payer: Self-pay

## 2020-05-25 DIAGNOSIS — G9340 Encephalopathy, unspecified: Secondary | ICD-10-CM

## 2020-05-25 DIAGNOSIS — R569 Unspecified convulsions: Secondary | ICD-10-CM

## 2020-05-25 LAB — CBC WITH DIFFERENTIAL/PLATELET
Abs Immature Granulocytes: 0.03 10*3/uL (ref 0.00–0.07)
Basophils Absolute: 0 10*3/uL (ref 0.0–0.1)
Basophils Relative: 0 %
Eosinophils Absolute: 0 10*3/uL (ref 0.0–0.5)
Eosinophils Relative: 0 %
HCT: 33.7 % — ABNORMAL LOW (ref 36.0–46.0)
Hemoglobin: 11.1 g/dL — ABNORMAL LOW (ref 12.0–15.0)
Immature Granulocytes: 0 %
Lymphocytes Relative: 14 %
Lymphs Abs: 1.2 10*3/uL (ref 0.7–4.0)
MCH: 31 pg (ref 26.0–34.0)
MCHC: 32.9 g/dL (ref 30.0–36.0)
MCV: 94.1 fL (ref 80.0–100.0)
Monocytes Absolute: 0.8 10*3/uL (ref 0.1–1.0)
Monocytes Relative: 10 %
Neutro Abs: 6.4 10*3/uL (ref 1.7–7.7)
Neutrophils Relative %: 76 %
Platelets: 300 10*3/uL (ref 150–400)
RBC: 3.58 MIL/uL — ABNORMAL LOW (ref 3.87–5.11)
RDW: 13.6 % (ref 11.5–15.5)
WBC: 8.5 10*3/uL (ref 4.0–10.5)
nRBC: 0 % (ref 0.0–0.2)

## 2020-05-25 LAB — BASIC METABOLIC PANEL
Anion gap: 7 (ref 5–15)
BUN: 6 mg/dL (ref 6–20)
CO2: 22 mmol/L (ref 22–32)
Calcium: 8.5 mg/dL — ABNORMAL LOW (ref 8.9–10.3)
Chloride: 108 mmol/L (ref 98–111)
Creatinine, Ser: 0.8 mg/dL (ref 0.44–1.00)
GFR, Estimated: >60 mL/min (ref >60)
Glucose, Bld: 93 mg/dL (ref 70–99)
Potassium: 3.8 mmol/L (ref 3.5–5.1)
Sodium: 137 mmol/L (ref 135–145)

## 2020-05-25 LAB — VITAMIN B12: Vitamin B-12: 465 pg/mL (ref 180–914)

## 2020-05-25 LAB — HIV ANTIBODY (ROUTINE TESTING W REFLEX): HIV Screen 4th Generation wRfx: NONREACTIVE

## 2020-05-25 LAB — FOLATE: Folate: 8.4 ng/mL (ref >5.9)

## 2020-05-25 LAB — T4, FREE: Free T4: 0.76 ng/dL (ref 0.61–1.12)

## 2020-05-25 LAB — VITAMIN D 25 HYDROXY (VIT D DEFICIENCY, FRACTURES): Vit D, 25-Hydroxy: 28.8 ng/mL — ABNORMAL LOW (ref 30–100)

## 2020-05-25 LAB — TSH: TSH: 0.264 u[IU]/mL — ABNORMAL LOW (ref 0.350–4.500)

## 2020-05-25 LAB — AMMONIA: Ammonia: 46 umol/L — ABNORMAL HIGH (ref 9–35)

## 2020-05-25 NOTE — Progress Notes (Signed)
Pt extremely anxious and agitated, currently presenting with shakes/tremors consistent with symptoms of seizing. PRN ativan administered.

## 2020-05-25 NOTE — Progress Notes (Signed)
Pt is at MRI; will do EEG later today as schedule permits.

## 2020-05-25 NOTE — Progress Notes (Signed)
EEG complete - results pending 

## 2020-05-25 NOTE — Progress Notes (Signed)
Subjective:   Alexis Deleon says she continues to feel just as tired and disoriented as yesterday. She continues to endorse suprapubic abdominal pain and says she does have a history of UTI and denies hysterectomy or other abdominal troubles. Says she has had a normal BM in the hospital that was not dark or bloody. Today, she tells me Xanax is her only home medication. She says she takes this 1-2x daily but will take additional doses for uncontrolled anxiety. She says she feels cold but denies fevers or chills or other recent illness. She says she lives at home by herself and continues to deny any history of alcohol or non-prescribed illicit substance use. She says she has had about 3-4 seizures in her lifetime - none of which occurred during childhood and had all occurred more recently. She says she'd like me to reach out to her "second mother" Alexis Deleon.   Objective:  Vital signs in last 24 hours: Vitals:   05/25/20 0837 05/25/20 1100 05/25/20 1342 05/25/20 1555  BP: 127/84  122/80 (!) 149/81  Pulse: 79 94 88   Resp: 18  18 18   Temp: 98.5 F (36.9 C)  98.5 F (36.9 C) 98.5 F (36.9 C)  TempSrc:    Oral  SpO2: 98%  96% 97%  Weight:      Height:       General: Patient is obese. She appears very tired although resting comfortably in no acute distress.  Eyes: Sclera non-icteric. No conjunctival injection. EOMI. Pupils are equal and normal in size.  HENT: Slightly dry mucus membranes. There is mild crusty nasal drainage.  Respiratory: Anterior lung sounds CTA, bilaterally. Effort of breathing is normal and she is saturating well on room air.  Cardiovascular: Rate is normal. Rhythm is regular. There is a 2-3/6 systolic murmur that can be heard throughout her entire chest, loudest along the left sternal border. She has trace non-pitting bilateral LE edema. Extremities are warm and well-perfused.   Abdominal: Obese. Soft and non-distended. There is no tenderness to palpation, guarding, or rebound.  Bowel sounds are intact throughout. Neurological: Patient was initially somnolent but became more alert with discussion. No focal neurological deficits noted.  MSK: Normal muscle bulk and tone.  Skin: There is erythema of the bilateral inguinal creases with small, flat petechiae L > R. There is irritation with raised papules along patient's inner thighs with scaly erythematous ~4x4" lesions on patient's bilateral anterior thighs. No other lesions or rashes noted. Skin is warm.  Psych: Pleasant and cooperative.   Assessment/Plan:  Active Problems:   Seizures (HCC)  # Seizure Activity  # Acidosis, Resolved # Leukocytosis, Resolved Alexis Deleon continues to appear tired and in a daze although is oriented and awakens easily. She says she has had about 3-4 seizures recently, none in childhood, and states that she has never been prescribed seizure medications (although had been prescribed Vimpat in the past per neurology). She admits to taking additional doses of Xanax for uncontrolled anxiety but does not specify how many and continues to deny having any other prescribed medications, hx of alcohol or other illicit substance use (non-prescribed). ETOH was <10 and urine tox was positive for benzos (prescribed) and opiates. Her ammonia was slightly elevated at 46. CIWA score intermittently elevated to 5 and 16 although otherwise 0. COWS scores of 0. She says she is aware that she had a seizure but says "no" when asked if she can remember any of the events that led up to her  seizure. Says she has otherwise been well recently. MRI showed a few nonspecific foci of T2 white matter hyperintensities although no other acute abnormalities. It is most likely her seizure is either primary vs. In the setting of withdrawal (less likely given mostly 0 CIWA/COWS scores). I anticipate both her acidosis and leukocytosis were reactive to her seizure as both resolved.  - Neurology following; appreciate their recommendations  -  EEG completed but pending  - HIV negative - Will discuss patient's case with her "second mother" Alexis Deleon at her request  - PT unable to evaluate so far, but OT is recommending SNF due to poor health literacy, memory concerns, balance / weakness concerns, and unsafe living situation - [facility] consulted and patient unfortunately is unable to go to SNF due to lack of insurance  - Continue Keppra 500mg  BID for now with plan to d/c if EEG is negative  - Continue frequent neuro checks and seizure precautions  - Continuous telemetry and pulse oximetry  # AKI with Mild Rhabdomyolysis, Resolved # Mild Rhabdomyolysis  Creatinine has normalized. It is highly likely this acute elevation was pre-renal as patient appeared very dry on initial exam and endorsed decreased PO recently. May also have been in the setting of urinary retention given initial bladder scan of >360cc's with suprapubic pain; however, I expect this was due to post-ictal state / medications.  - Will d/c foley and attempt voiding trial  - Strict I&O  - Continue to encourage PO intake if bedside swallow remains appropriate  - Will hold off on further IVF for now  - Continue to monitor renal function daily while admitted   # Protein Malnutrition  Albumin was slightly low at 3.4. She does note that she has been eating and drinking less recently but does not specify for how long. - Encourage PO intake pending bedside swallow - PT/OT consulted - TOC consulted   # Hypertension, Improved  # Sinus Tachycardia, Resolved  # Subclinical Hyperthyroidism Patient's blood pressures have been better controlled today without antihypertensives PTA or currently. Her TSH is low at 0.264 although her free T4 is normal. Could be in the setting of withdrawal.  - T3 is pending  - Continue to monitor frequent vitals   # Hx of Polysubstance Use  Patient has had urine tox's return positive for amphetamines, opiates, benzos, and cannabinoids in the past  although also has diagnoses of ADHD, chronic pain, and anxiety and has been prescribed Adderall and Norco in the past and Xanax more recently although denies history of marijuana use, IVDU or alcohol use. Her ETOH was <10 although per ED note, patient may have been drinking alcohol yesterday and there was concern her Xanax prescription had run out too soon. She does note taking extra Xanax for anxiety.  - Will reach out to [facility] for corroboration  - Continue CIWA and COWS monitoring  - Continue close monitoring of vitals, telemetry, and pulse oximetry   # Intertrigo  Patient's bilateral groin rash (L > R) is most consistent with intertrigo (vs. Much less likely trauma).  - Start Nystatin powder BID   Code Status: Full Code  Diet: Regular, Thin Fluids IVF: None DVT PPx: Lovenox 40mg  daily  Prior to Admission Living Arrangement: Says she lived at home alone  Anticipated Discharge Location: ? Home, pending TOC evaluation (also for PCP) Barriers to Discharge: EEG / living resources Dispo: Anticipated discharge tomorrow.  [facility], MD 05/25/2020, 4:26 PM Pager: [phone] After 5pm on weekdays and 1pm on  weekends: On Call pager [phone]

## 2020-05-25 NOTE — Procedures (Signed)
Patient Name: Alexis Deleon  MRN: [id number]  Epilepsy Attending: Charlsie Quest  Referring Physician/Provider: Dr Darnelle Catalan Date: 05/25/2020 Duration: 28.23 mins  Patient history: 45 year old female with breakthrough seizures in the setting of alcohol and Xanax use.  EEG to evaluate for seizures.  Level of alertness: Awake, asleep  AEDs during EEG study: Keppra, Ativan   Technical aspects: This EEG study was done with scalp electrodes positioned according to the 10-20 International system of electrode placement. Electrical activity was acquired at a sampling rate of 500Hz  and reviewed with a high frequency filter of 70Hz  and a low frequency filter of 1Hz . EEG data were recorded continuously and digitally stored.   Description: No clear posterior dominant rhythm was seen.  Sleep was characterized by vertex waves, sleep spindles (12 to 14 Hz), maximal frontocentral region.  There is an excessive amount of 15 to 18 Hz beta activity distributed symmetrically and diffusely. Hyperventilation and photic stimulation were not performed.     ABNORMALITY - Excessive beta, generalized  IMPRESSION: This study is within normal limits. No seizures or epileptiform discharges were seen throughout the recording. The excessive beta activity seen in the background is most likely due to the effect of benzodiazepine and is a benign EEG pattern.   [name] 

## 2020-05-25 NOTE — Progress Notes (Addendum)
Neurology Progress Note  Brief HPI: 45 y.o. female with a PMHx of a single previous seizure in the setting of multiple stimulant use who presents with witnessed seizure. She had been staying with a friend with ETOH use and Xanax use- unclear amounts of ETOH and Xanax use due to patient AMS on arrival. She was found by friends covered in feces and blood with AMS and EMS was activated. EMS witnessed frank seizure activity.   Subjective: Patient endorses feeling "off" for a couple of weeks. Denies opiate or ETOH use despite reports of ETOH and urine toxicology results. Denies recollection of events leading to hospitalization. Endorses tongue pain on examination causing speech difficulties  Exam: Vitals:   05/24/20 2157 05/25/20 0733  BP: 125/66 126/78  Pulse: 85   Resp: 19 19  Temp: 98.6 F (37 C) 98.7 F (37.1 C)  SpO2: 99% 97%   Gen: Sitting up at bedside in recliner, in no acute distress EENT: Tongue with macroglossia and a scalloped appearance on examination  Resp: non-labored breathing, no respiratory distress Abd: soft, non-distended, non-tender  Neuro: Mental Status: Awake, sitting up at bedside on assessment. Alert and oriented to self and place. She does not recall events leading up to hospitalization. States incorrectly that the month is April, the year is 74, and that she is 45 years old.  Speech is mildly dysarthric which she attributes to tongue pain.  There is no aphasia or evidence of neglect on examination. Repetition and naming are intact. Cranial Nerves:  PERRL 4 mm / brisk, visual fields are full, EOMI without ptosis, facial sensation intact and symmetric to light touch, face is symmetric resting and smiling, hearing is intact to voice, palate rises symmetrically, shoulder shrug is symmetric, phonation intact, tongue protrudes midline.  Motor: All extremities with antigravity movement and without vertical drift on assessment. Tone and bulk are normal. Sensory:  Sensation intact and symmetric to light touch throughout upper and lower extremities DTR: 3+ and symmetric patellae, 2+ and symmetric biceps  Pertinent Labs: CBC    Component Value Date/Time   WBC 8.5 05/25/2020 0244   RBC 3.58 (L) 05/25/2020 0244   HGB 11.1 (L) 05/25/2020 0244   HGB 11.1 (L) 02/02/2013 0422   HCT 33.7 (L) 05/25/2020 0244   HCT 32.4 (L) 02/02/2013 0422   PLT 300 05/25/2020 0244   PLT 207 02/02/2013 0422   MCV 94.1 05/25/2020 0244   MCV 94 02/02/2013 0422   MCH 31.0 05/25/2020 0244   MCHC 32.9 05/25/2020 0244   RDW 13.6 05/25/2020 0244   RDW 12.6 02/02/2013 0422   LYMPHSABS 1.2 05/25/2020 0244   LYMPHSABS 0.3 (L) 02/02/2013 0422   MONOABS 0.8 05/25/2020 0244   MONOABS 0.3 02/02/2013 0422   EOSABS 0.0 05/25/2020 0244   EOSABS 0.0 02/02/2013 0422   BASOSABS 0.0 05/25/2020 0244   BASOSABS 0.0 02/02/2013 0422   CMP     Component Value Date/Time   NA 137 05/25/2020 0244   NA 135 (L) 02/01/2013 1210   K 3.8 05/25/2020 0244   K 3.6 02/01/2013 1210   CL 108 05/25/2020 0244   CL 104 02/01/2013 1210   CO2 22 05/25/2020 0244   CO2 29 02/01/2013 1210   GLUCOSE 93 05/25/2020 0244   GLUCOSE 118 (H) 02/01/2013 1210   BUN 6 05/25/2020 0244   BUN 11 02/01/2013 1210   CREATININE 0.80 05/25/2020 0244   CREATININE 0.75 02/01/2013 1210   CALCIUM 8.5 (L) 05/25/2020 0244   CALCIUM 8.9 02/01/2013  1210   PROT 6.3 (L) 05/24/2020 1251   PROT 7.4 02/01/2013 1210   ALBUMIN 3.4 (L) 05/24/2020 1251   ALBUMIN 3.8 02/01/2013 1210   AST 38 05/24/2020 1251   AST 30 02/01/2013 1210   ALT 22 05/24/2020 1251   ALT 37 02/01/2013 1210   ALKPHOS 71 05/24/2020 1251   ALKPHOS 69 02/01/2013 1210   BILITOT 0.5 05/24/2020 1251   BILITOT 0.5 02/01/2013 1210   GFRNONAA >60 05/25/2020 0244   GFRNONAA >60 02/01/2013 1210   GFRAA >60 09/22/2019 0538   GFRAA >60 02/01/2013 1210   Magnesium: 2.4  Drugs of Abuse     Component Value Date/Time   LABOPIA POSITIVE (A) 05/24/2020 1924    COCAINSCRNUR NONE DETECTED 05/24/2020 1924   COCAINSCRNUR NONE DETECTED 09/22/2019 1156   LABBENZ POSITIVE (A) 05/24/2020 1924   AMPHETMU NONE DETECTED 05/24/2020 1924   THCU NONE DETECTED 05/24/2020 1924   LABBARB NONE DETECTED 05/24/2020 1924    Urinalysis    Component Value Date/Time   COLORURINE YELLOW 05/24/2020 1924   APPEARANCEUR CLEAR 05/24/2020 1924   APPEARANCEUR Hazy 02/01/2013 1210   LABSPEC 1.019 05/24/2020 1924   LABSPEC 1.021 02/01/2013 1210   PHURINE 5.0 05/24/2020 1924   GLUCOSEU NEGATIVE 05/24/2020 1924   GLUCOSEU Negative 02/01/2013 1210   HGBUR NEGATIVE 05/24/2020 1924   BILIRUBINUR NEGATIVE 05/24/2020 1924   BILIRUBINUR Negative 02/01/2013 1210   KETONESUR 80 (A) 05/24/2020 1924   PROTEINUR NEGATIVE 05/24/2020 1924   NITRITE NEGATIVE 05/24/2020 1924   LEUKOCYTESUR NEGATIVE 05/24/2020 1924   LEUKOCYTESUR Negative 02/01/2013 1210   Lab Results  Component Value Date   TSH 0.264 (L) 05/25/2020   Alcohol Level    Component Value Date/Time   ETH <10 05/24/2020 1251   Additional lab work pending: further thyroid studies, Vitamin D, Vitamin B12, Folate, and ammonia levels.  Imaging Reviewed: CT head- unremarkable  Impression: 45 year old female with breakthrough seizures in the setting of alcohol and Xanax use.  With her empty Xanax bottle, I think that withdrawal is certainly a possibility. I am not at all confident in her self-report of when she ran out of medication / only uses PRN for anxiety. Underlying seizure disorder could be a consideration, but I do not think we have enough information at the current time to state that definitively. Given the recurrent nature of the seizures and fact that we do not have evidence of withdrawal, I do think continuing Keppra is reasonable in the immediate timeframe.  Recommendations: - MRI brain  - Continue Keppra 500 mg BID for now - Follow up EEG results - If the above are negative, and the patient returns to  baseline may consider discontinuing antiepileptics. -Check B12, ammonia, Vit D. TSH. - Follow up pending lab work  [employer], AGACNP-BC [facility] [phone]   Attending Neurohospitalist Addendum Patient seen and examined with APP/Resident. Agree with the history and physical as documented above. Agree with the plan as documented, which I helped formulate. I have independently reviewed the chart, obtained history, review of systems and examined the patient.I have personally reviewed pertinent head/neck/spine imaging (CT/MRI).  MRI brain - IMPRESSION: 1. No acute intracranial abnormality. 2. A few punctate foci of T2 hyperintense within the white matter of the cerebral hemispheres, nonspecific. Differential diagnosis include migraine headache, early chronic microvascular ischemic changes and post inflammatory/infectious processes. Agree with radiology - non specific WM changes in the brain - no clear clinical significance. EEG - pending-if negative, will d/c AEDs.  Please  feel free to call with any questions.  -- Milon Dikes, MD Neurologist [facility] Pager: [phone]

## 2020-05-25 NOTE — Progress Notes (Signed)
PT Cancellation Note  Patient Details Name: Alexis Deleon MRN: [id number] DOB: Aug 14, 1975   Cancelled Treatment:    Reason Eval/Treat Not Completed: Patient at procedure or test/unavailable Pt currently out of room at MRI. Will follow up as schedule allows.   Cindee Salt, DPT  Acute Rehabilitation Services  Pager: [phone] Office: [phone]    [name] 05/25/2020, 11:32 AM

## 2020-05-25 NOTE — Evaluation (Signed)
Occupational Therapy Evaluation Patient Details Name: Alexis Deleon MRN: [id number] DOB: [birth year] Today's Date: 05/25/2020    History of Present Illness 45 y.o. female presenting via EMS secondary to witnessed seizures. Patient admitted with seizure disorder vs. substance-induced seizure vs. medication / alcohol withdrawal seizure, AKI, and mild rhabdomyolysis. PMHx significant for T12 burst fx., lumbago, ADHD, Hx of polysubstance abuse, and anxiety/depression.   Clinical Impression   Patient is a poor historian. PTA patient reports living alone in a ground floor apartment and reports independent with ADLs/IADLs including driving and working part-time. Patient unable to recall what she did for work. Patient currently presents below baseline level of function demonstrating LB dressing and toileting/hygiene/clothing management with Min A and cues for safety. Patient also limited by decreased safety awareness, generalized weakness, decreased cognition and decreased balance presenting increased fall risk. Patient reports having no family/friends to assist upon d/c and is not safe to return home alone. Recommendation for SNF rehab pending patient progress given CLOF, lack of family support and current deficits. OT will continue to follow acutely.     Follow Up Recommendations  SNF;Supervision/Assistance - 24 hour    Equipment Recommendations  None recommended by OT    Recommendations for Other Services       Precautions / Restrictions Precautions Precautions: Fall Restrictions Weight Bearing Restrictions: No      Mobility Bed Mobility Overal bed mobility: Needs Assistance Bed Mobility: Supine to Sit     Supine to sit: Min guard     General bed mobility comments: Min guard for safety.    Transfers Overall transfer level: Needs assistance Equipment used: None Transfers: Sit to/from Stand Sit to Stand: Min guard         General transfer comment: Min guard for  safety/steadying.    Balance Overall balance assessment: Needs assistance Sitting-balance support: Single extremity supported;No upper extremity supported;Feet supported Sitting balance-Leahy Scale: Fair Sitting balance - Comments: Patient with posterior and lateral LOB while attempting to don footwear seated EOB. Able to maintain static sitting balance with supervision A for safety.   Standing balance support: No upper extremity supported;During functional activity Standing balance-Leahy Scale: Poor Standing balance comment: Patient with several lateral and posterior LOB with short-distance mobility in room requiring Min A to correct.                           ADL either performed or assessed with clinical judgement   ADL Overall ADL's : Needs assistance/impaired     Grooming: Minimal assistance;Standing Grooming Details (indicate cue type and reason): Min A for steadying/balance.             Lower Body Dressing: Minimal assistance;Sit to/from stand Lower Body Dressing Details (indicate cue type and reason): Min A to don footwear seated EOB. Toilet Transfer: Hydrographic surveyor Details (indicate cue type and reason): Min gaurd for steadying/safety with cues for hand placement. Toileting- Architect and Hygiene: Min guard;Sit to/from stand Toileting - Clothing Manipulation Details (indicate cue type and reason): Min guard for steadying/balance and cues for safety.     Functional mobility during ADLs: Minimal assistance General ADL Comments: Patient limited by generalized weakness, decreased cognition, and decreaed balance with several episodes of lateral and posterior LOB with short-distance functional mobility.     Vision Baseline Vision/History: No visual deficits Patient Visual Report: No change from baseline Vision Assessment?: No apparent visual deficits     Perception  Praxis      Pertinent Vitals/Pain Pain Assessment: No/denies  pain     Hand Dominance Right   Extremity/Trunk Assessment Upper Extremity Assessment Upper Extremity Assessment: Generalized weakness   Lower Extremity Assessment Lower Extremity Assessment: Defer to PT evaluation   Cervical / Trunk Assessment Cervical / Trunk Assessment: Normal   Communication Communication Communication: No difficulties   Cognition Arousal/Alertness: Awake/alert Behavior During Therapy: Flat affect Overall Cognitive Status: Impaired/Different from baseline Area of Impairment: Orientation;Attention;Memory;Safety/judgement;Awareness;Problem solving                 Orientation Level: Disoriented to;Time;Situation Current Attention Level: Sustained Memory: Decreased short-term memory   Safety/Judgement: Decreased awareness of safety Awareness: Intellectual Problem Solving: Slow processing General Comments: Patient oriented to person and place only. Unable to recall name of hosptial. Initially states that she's at [facility]. Patient with poor safety awareness requiring verbal cues throughout. Unable to recall some aspects of PLOF. Able to recall 2/3 words on BIMs after 3 min delay.   General Comments       Exercises     Shoulder Instructions      Home Living Family/patient expects to be discharged to:: Private residence Living Arrangements: Alone Available Help at Discharge: Other (Comment) (None) Type of Home: Apartment Home Access: Level entry     Home Layout: One level     Bathroom Shower/Tub: Chief Strategy Officer: Standard     Home Equipment: None          Prior Functioning/Environment Level of Independence: Independent        Comments: Patient is a questionable historian. Reports I with ADLs/IADLs, driving and working part-time. Patient unable to recall what she did for work. Will need to confirm.        OT Problem List: Decreased strength;Impaired balance (sitting and/or standing);Decreased  cognition;Decreased safety awareness      OT Treatment/Interventions: Self-care/ADL training;Therapeutic exercise;Energy conservation;DME and/or AE instruction;Therapeutic activities;Cognitive remediation/compensation;Patient/family education;Balance training    OT Goals(Current goals can be found in the care plan section) Acute Rehab OT Goals Patient Stated Goal: Patient in agreement with rehab. OT Goal Formulation: With patient Time For Goal Achievement: 06/08/20 Potential to Achieve Goals: Good  OT Frequency: Min 2X/week   Barriers to D/C:            Co-evaluation              AM-PAC OT "6 Clicks" Daily Activity     Outcome Measure Help from another person eating meals?: None Help from another person taking care of personal grooming?: A Little Help from another person toileting, which includes using toliet, bedpan, or urinal?: A Little Help from another person bathing (including washing, rinsing, drying)?: A Little Help from another person to put on and taking off regular upper body clothing?: A Little Help from another person to put on and taking off regular lower body clothing?: A Little 6 Click Score: 19   End of Session Equipment Utilized During Treatment: Gait belt Nurse Communication: Mobility status  Activity Tolerance: Patient tolerated treatment well Patient left: in chair;with call bell/phone within reach;with chair alarm set  OT Visit Diagnosis: Unsteadiness on feet (R26.81);Muscle weakness (generalized) (M62.81);Other symptoms and signs involving cognitive function                Time: 5638-7564 OT Time Calculation (min): 25 min Charges:  OT General Charges $OT Visit: 1 Visit OT Evaluation $OT Eval Moderate Complexity: 1 Mod OT Treatments $Self Care/Home Management :  8-22 mins  Wallace Cogliano H. OTR/L Supplemental OT, Department of rehab services [phone]  Rosielee Corporan R H. 05/25/2020, 9:01 AM

## 2020-05-25 NOTE — Hospital Course (Signed)
Follow up: - Needs outpatient ECHO 3/6 murmur throughout  Discharge - COPY and PASTE HPI!  # Chronic Back Pain, S Patient endorses lateral back pain R > L that is more consistent with flank tenderness. She states this is different from her back pain associated with her T12 fracture that she follows with [facility] for although full examination was not possible today given AMS.  - Check urinalysis  - Continue to monitor

## 2020-05-26 LAB — COMPREHENSIVE METABOLIC PANEL
ALT: 19 U/L (ref 0–44)
AST: 31 U/L (ref 15–41)
Albumin: 2.7 g/dL — ABNORMAL LOW (ref 3.5–5.0)
Alkaline Phosphatase: 58 U/L (ref 38–126)
Anion gap: 5 (ref 5–15)
BUN: 8 mg/dL (ref 6–20)
CO2: 25 mmol/L (ref 22–32)
Calcium: 8.3 mg/dL — ABNORMAL LOW (ref 8.9–10.3)
Chloride: 110 mmol/L (ref 98–111)
Creatinine, Ser: 0.76 mg/dL (ref 0.44–1.00)
GFR, Estimated: >60 mL/min (ref >60)
Glucose, Bld: 92 mg/dL (ref 70–99)
Potassium: 3.3 mmol/L — ABNORMAL LOW (ref 3.5–5.1)
Sodium: 140 mmol/L (ref 135–145)
Total Bilirubin: 0.6 mg/dL (ref 0.3–1.2)
Total Protein: 5.1 g/dL — ABNORMAL LOW (ref 6.5–8.1)

## 2020-05-26 LAB — CBC
HCT: 36.5 % (ref 36.0–46.0)
Hemoglobin: 12 g/dL (ref 12.0–15.0)
MCH: 30.8 pg (ref 26.0–34.0)
MCHC: 32.9 g/dL (ref 30.0–36.0)
MCV: 93.6 fL (ref 80.0–100.0)
Platelets: 310 10*3/uL (ref 150–400)
RBC: 3.9 MIL/uL (ref 3.87–5.11)
RDW: 13.2 % (ref 11.5–15.5)
WBC: 4.5 10*3/uL (ref 4.0–10.5)
nRBC: 0 % (ref 0.0–0.2)

## 2020-05-26 LAB — HEPATITIS PANEL, ACUTE
HCV Ab: NONREACTIVE
Hep A IgM: NONREACTIVE
Hep B C IgM: NONREACTIVE
Hepatitis B Surface Ag: NONREACTIVE

## 2020-05-26 LAB — T3, FREE: T3, Free: 1.7 pg/mL — ABNORMAL LOW (ref 2.0–4.4)

## 2020-05-26 LAB — GLUCOSE, CAPILLARY: Glucose-Capillary: 101 mg/dL — ABNORMAL HIGH (ref 70–99)

## 2020-05-26 MED ORDER — MAGIC MOUTHWASH W/LIDOCAINE
5.0000 mL | Freq: Three times a day (TID) | ORAL | Status: DC | PRN
  Administered 2020-05-26: 5 mL via ORAL
  Filled 2020-05-26: qty 5

## 2020-05-26 MED ORDER — NYSTATIN 100000 UNIT/GM EX POWD: Freq: Two times a day (BID) | CUTANEOUS | 0 refills | Status: DC

## 2020-05-26 MED ORDER — CHOLECALCIFEROL 10 MCG (400 UNIT) PO TABS
800.0000 [IU] | ORAL_TABLET | Freq: Every day | ORAL | Status: DC
  Administered 2020-05-26 – 2020-05-27 (×2): 800 [IU] via ORAL
  Filled 2020-05-26 (×2): qty 2

## 2020-05-26 MED ORDER — CHOLECALCIFEROL 10 MCG (400 UNIT) PO TABS: 800.0000 [IU] | ORAL_TABLET | Freq: Every day | ORAL | 0 refills | Status: AC

## 2020-05-26 NOTE — Progress Notes (Signed)
Neurology Progress Note  Brief HPI: 45 y.o.femalewith a PMHx of a single previous seizure in the setting of multiple stimulant use who presents with witnessed seizure. She had been staying with a friend with ETOH use and Xanax use- unclear amounts of ETOH and Xanax use due to patient AMS on arrival. She was found by friends covered in feces and blood with AMS and EMS was activated. EMS witnessed frank seizure activity.   Subjective: No acute overnight events Per chart review, patient became anxious and agitated yesterday morning with shakes/tremors and was given Ativan due to concern for seizure- neurology was not notified at this time EEG yesterday afternoon was within normal limits without seizures or epileptiform discharges Patient states that she is ready to go home but remains disoriented   Exam: Vitals:   05/25/20 2231 05/26/20 0400  BP: (!) 150/89 139/61  Pulse: 75 72  Resp: 18   Temp: 98.4 F (36.9 C)   SpO2:     Gen: Sitting up at bedside in recliner, in no acute distress EENT: Tongue with macroglossia and a scalloped appearance on examination  Resp: non-labored breathing, no respiratory distress Abd: soft, non-distended, non-tender  Neuro: Mental Status: Asleep laying in bed, wakes easily to voice. Alert and oriented to self, year, and place. She still does not recall events leading up to hospitalization or what state she was in on hospital arrical. States incorrectly that the month is September and that she is 45 years old.  Speech is mildly dysarthric which she attributes to tongue pain.  There is no aphasia or evidence of neglect on examination. Repetition and naming are intact. Cranial Nerves:  PERRL 4 mm / brisk, visual fields are full, EOMI without ptosis, facial sensation intact and symmetric to light touch, face is symmetric resting and smiling, hearing is intact to voice, palate rises symmetrically, shoulder shrug is symmetric, phonation intact, tongue protrudes  midline.  Motor: All extremities with antigravity movement and without vertical drift on assessment. Tone and bulk are normal. Sensory: Sensation intact and symmetric to light touch throughout upper and lower extremities Cerebellar: FNF intact bilaterally  Pertinent Labs: CBC    Component Value Date/Time   WBC 4.5 05/26/2020 0652   RBC 3.90 05/26/2020 0652   HGB 12.0 05/26/2020 0652   HGB 11.1 (L) 02/02/2013 0422   HCT 36.5 05/26/2020 0652   HCT 32.4 (L) 02/02/2013 0422   PLT 310 05/26/2020 0652   PLT 207 02/02/2013 0422   MCV 93.6 05/26/2020 0652   MCV 94 02/02/2013 0422   MCH 30.8 05/26/2020 0652   MCHC 32.9 05/26/2020 0652   RDW 13.2 05/26/2020 0652   RDW 12.6 02/02/2013 0422   LYMPHSABS 1.2 05/25/2020 0244   LYMPHSABS 0.3 (L) 02/02/2013 0422   MONOABS 0.8 05/25/2020 0244   MONOABS 0.3 02/02/2013 0422   EOSABS 0.0 05/25/2020 0244   EOSABS 0.0 02/02/2013 0422   BASOSABS 0.0 05/25/2020 0244   BASOSABS 0.0 02/02/2013 0422   CMP     Component Value Date/Time   NA 137 05/25/2020 0244   NA 135 (L) 02/01/2013 1210   K 3.8 05/25/2020 0244   K 3.6 02/01/2013 1210   CL 108 05/25/2020 0244   CL 104 02/01/2013 1210   CO2 22 05/25/2020 0244   CO2 29 02/01/2013 1210   GLUCOSE 93 05/25/2020 0244   GLUCOSE 118 (H) 02/01/2013 1210   BUN 6 05/25/2020 0244   BUN 11 02/01/2013 1210   CREATININE 0.80 05/25/2020 0244  CREATININE 0.75 02/01/2013 1210   CALCIUM 8.5 (L) 05/25/2020 0244   CALCIUM 8.9 02/01/2013 1210   PROT 6.3 (L) 05/24/2020 1251   PROT 7.4 02/01/2013 1210   ALBUMIN 3.4 (L) 05/24/2020 1251   ALBUMIN 3.8 02/01/2013 1210   AST 38 05/24/2020 1251   AST 30 02/01/2013 1210   ALT 22 05/24/2020 1251   ALT 37 02/01/2013 1210   ALKPHOS 71 05/24/2020 1251   ALKPHOS 69 02/01/2013 1210   BILITOT 0.5 05/24/2020 1251   BILITOT 0.5 02/01/2013 1210   GFRNONAA >60 05/25/2020 0244   GFRNONAA >60 02/01/2013 1210   GFRAA >60 09/22/2019 0538   GFRAA >60 02/01/2013 1210      Ref. Range 05/25/2020 12:53  Ammonia Latest Ref Range: 9 - 35 umol/L 46 (H)  Folate Latest Ref Range: >5.9 ng/mL 8.4  Vitamin D, 25-Hydroxy Latest Ref Range: 30 - 100 ng/mL 28.80 (L)  Vitamin B12 Latest Ref Range: 180 - 914 pg/mL 465    Ref. Range 05/25/2020 02:44  HIV Screen 4th Generation wRfx Latest Ref Range: Non Reactive  Non Reactive   Lab Results  Component Value Date   TSH 0.264 (L) 05/25/2020   Lab Results  Component Value Date   VITAMINB12 465 05/25/2020   Last vitamin D Lab Results  Component Value Date   VD25OH 28.80 (L) 05/25/2020   Lab Results  Component Value Date   FOLATE 8.4 05/25/2020   Ammonia slightly elevated at 46  Imaging Reviewed: CT head- unremarkable  MRI brain -  1. No acute intracranial abnormality. 2. A few punctate foci of T2 hyperintense within the white matter of the cerebral hemispheres, nonspecific. Differential diagnosis include migraine headache, early chronic microvascular ischemic changes and post inflammatory/infectious processes. Agree with radiology - non specific WM changes in the brain - no clear clinical significance.  Impression: 45 year old female with breakthrough seizures in the setting of alcohol and Xanax use. With her empty Xanax bottle, I think that withdrawal is certainly a possibility. EEG normal-not inclined to keep her on antiepileptics considering this is a provoked seizure.  Recommendations: DC antiepileptics Maintain seizure precautions as documented below   Lanae Boast, AGACNP-BC [facility] [phone]  Attending addendum Case was discussed with me and I helped formulate the plan  -- Milon Dikes, MD Neurologist [facility] Pager: [phone]

## 2020-05-26 NOTE — Plan of Care (Signed)

## 2020-05-26 NOTE — Progress Notes (Signed)
This patient's plan of care was discussed with the house staff. Please see their note for complete details. I concur with their findings.  She is much more awake today. She is clear. Will d/i neuro re: further needs. She will be d/c to home when home safety established.

## 2020-05-26 NOTE — Evaluation (Signed)
Physical Therapy Evaluation and Discharge Patient Details Name: Alexis Deleon MRN: [id number] DOB: Apr 03, 1975 Today's Date: 05/26/2020   History of Present Illness  45 y.o. female presenting via EMS secondary to witnessed seizures. Patient admitted with seizure disorder vs. substance-induced seizure vs. medication / alcohol withdrawal seizure, AKI, and mild rhabdomyolysis. PMHx significant for T12 burst fx., lumbago, ADHD, Hx of polysubstance abuse, and anxiety/depression.  Clinical Impression   Patient evaluated by Physical Therapy with no further acute PT needs identified. Patient mobilizing independently in her room (did not go out into hall as pt refused gripper socks and without shoes). See below for balance assessment--pt with no imbalance during testing.  PT is signing off. Thank you for this referral.     Follow Up Recommendations No PT follow up    Equipment Recommendations  None recommended by PT    Recommendations for Other Services       Precautions / Restrictions Precautions Precautions: Fall      Mobility  Bed Mobility Overal bed mobility: Independent Bed Mobility: Supine to Sit     Supine to sit: Independent     General bed mobility comments: assist with lines, but pt showing awareness of lines    Transfers Overall transfer level: Independent Equipment used: None Transfers: Sit to/from Stand Sit to Stand: Independent         General transfer comment: no imbalance noted  Ambulation/Gait Ambulation/Gait assistance: Independent Gait Distance (Feet): 35 Feet Assistive device: None Gait Pattern/deviations: WFL(Within Functional Limits)     General Gait Details: in room because pt did not want to wear gripper socks and had no shoes  Stairs            Wheelchair Mobility    Modified Rankin (Stroke Patients Only)       Balance Overall balance assessment: Needs assistance Sitting-balance support: No upper extremity supported;Feet  supported Sitting balance-Leahy Scale: Fair     Standing balance support: No upper extremity supported;During functional activity Standing balance-Leahy Scale: Good Standing balance comment: no loss of balance     Tandem Stance - Right Leg: 15 Tandem Stance - Left Leg: 15 Rhomberg - Eyes Opened: 20 Rhomberg - Eyes Closed: 0 (pt not following instructions and stepping feet apart when closing eyes)                 Pertinent Vitals/Pain Pain Assessment: No/denies pain    Home Living Family/patient expects to be discharged to:: Private residence Living Arrangements: Non-relatives/Friends Available Help at Discharge: Friend(s);Available PRN/intermittently Type of Home: Apartment Home Access: Level entry     Home Layout: One level Home Equipment: None Additional Comments: now reports she is going to stay with a friend on discharge    Prior Function Level of Independence: Independent               Hand Dominance   Dominant Hand: Right    Extremity/Trunk Assessment   Upper Extremity Assessment Upper Extremity Assessment: Defer to OT evaluation    Lower Extremity Assessment Lower Extremity Assessment: Overall WFL for tasks assessed    Cervical / Trunk Assessment Cervical / Trunk Assessment: Normal  Communication   Communication: No difficulties  Cognition Arousal/Alertness: Awake/alert Behavior During Therapy: Flat affect Overall Cognitive Status: Impaired/Different from baseline Area of Impairment: Following commands                   Current Attention Level: Selective   Following Commands: Follows one step commands inconsistently  Problem Solving: Slow processing General Comments: After removing covers so she could get out of bed, paused for BP to take and she re-covered herself due to being cold. "oh I forgot I was going to get up." When asked to stand with feet together and then asked to close her eyes, she stepped feet apart and then  closed eyes (as we had just tested)      General Comments General comments (skin integrity, edema, etc.): Reports she is being discharged today    Exercises     Assessment/Plan    PT Assessment Patent does not need any further PT services  PT Problem List         PT Treatment Interventions      PT Goals (Current goals can be found in the Care Plan section)  Acute Rehab PT Goals Patient Stated Goal: patient wants to go home and stay with her friend PT Goal Formulation: All assessment and education complete, DC therapy    Frequency     Barriers to discharge        Co-evaluation               AM-PAC PT "6 Clicks" Mobility  Outcome Measure Help needed turning from your back to your side while in a flat bed without using bedrails?: None Help needed moving from lying on your back to sitting on the side of a flat bed without using bedrails?: None Help needed moving to and from a bed to a chair (including a wheelchair)?: None Help needed standing up from a chair using your arms (e.g., wheelchair or bedside chair)?: None Help needed to walk in hospital room?: None Help needed climbing 3-5 steps with a railing? : None 6 Click Score: 24    End of Session   Activity Tolerance: Patient tolerated treatment well Patient left: in chair;with call bell/phone within reach;with chair alarm set Nurse Communication: Mobility status PT Visit Diagnosis: Difficulty in walking, not elsewhere classified (R26.2)    Time: 2355-7322 PT Time Calculation (min) (ACUTE ONLY): 19 min   Charges:   PT Evaluation $PT Eval Low Complexity: 1 Low           Jerolyn Center, PT Pager [phone]   Zena Amos 05/26/2020, 3:38 PM

## 2020-05-26 NOTE — Progress Notes (Signed)
Patient is able to void this shift without difficulties. No s/s of tremors or seizures noted.

## 2020-05-26 NOTE — Progress Notes (Incomplete)
Subjective:   Alexis Deleon doing okay this morning. States she doesn't remember seeing social worker yesterday. Mentions her friend Alexis Deleon wants her to apply for insurance and disability here. Discussed that this is an outpatient situation. Also mentioned we will have social work help with disposition planning.   Objective:  Vital signs in last 24 hours: Vitals:   05/25/20 1555 05/25/20 2231 05/26/20 0400 05/26/20 0448  BP: (!) 149/81 (!) 150/89 139/61   Pulse:  75 72   Resp: 18 18    Temp: 98.5 F (36.9 C) 98.4 F (36.9 C)    TempSrc: Oral Oral    SpO2: 97%     Weight:    107.4 kg  Height:       General: Patient is obese. She appears very tired although resting comfortably in no acute distress.  Eyes: Sclera non-icteric. No conjunctival injection. EOMI. Pupils are equal and normal in size.  HENT: Slightly dry mucus membranes. There is mild crusty nasal drainage.  Respiratory: Anterior lung sounds CTA, bilaterally. Effort of breathing is normal and she is saturating well on room air.  Cardiovascular: Rate is normal. Rhythm is regular. There is a 2-3/6 systolic murmur that can be heard throughout her entire chest, loudest along the left sternal border. She has trace non-pitting bilateral LE edema. Extremities are warm and well-perfused.   Abdominal: Obese. Soft and non-distended. There is no tenderness to palpation, guarding, or rebound. Bowel sounds are intact throughout. Neurological: Patient was initially somnolent but became more alert with discussion. No focal neurological deficits noted.  MSK: Normal muscle bulk and tone.  Skin: There is erythema of the bilateral inguinal creases with small, flat petechiae L > R. There is irritation with raised papules along patient's inner thighs with scaly erythematous ~4x4" lesions on patient's bilateral anterior thighs. No other lesions or rashes noted. Skin is warm.  Psych: Pleasant and cooperative.   Assessment/Plan:  Active Problems:    Seizures (HCC)   Encephalopathy   Seizure-like activity (HCC)  # Seizure Activity  # Acidosis, Resolved # Leukocytosis, Resolved Alexis Deleon continues to appear tired and in a daze although is oriented and awakens easily. She says she has had about 3-4 seizures recently, none in childhood, and states that she has never been prescribed seizure medications (although had been prescribed Vimpat in the past per neurology). She admits to taking additional doses of Xanax for uncontrolled anxiety but does not specify how many and continues to deny having any other prescribed medications, hx of alcohol or other illicit substance use (non-prescribed). ETOH was <10 and urine tox was positive for benzos (prescribed) and opiates. Her ammonia was slightly elevated at 46. CIWA score intermittently elevated to 5 and 16 although otherwise 0. COWS scores of 0. She says she is aware that she had a seizure but says "no" when asked if she can remember any of the events that led up to her seizure. Says she has otherwise been well recently. MRI showed a few nonspecific foci of T2 white matter hyperintensities although no other acute abnormalities. It is most likely her seizure is either primary vs. In the setting of withdrawal (less likely given mostly 0 CIWA/COWS scores). I anticipate both her acidosis and leukocytosis were reactive to her seizure as both resolved.  - Neurology following; appreciate their recommendations  - EEG completed but pending  - HIV negative - Will discuss patient's case with her "second mother" Alexis Deleon at her request  - PT unable to evaluate so  far, but OT is recommending SNF due to poor health literacy, memory concerns, balance / weakness concerns, and unsafe living situation - Alexis Deleon consulted and patient unfortunately is unable to go to SNF due to lack of insurance  - Continue Keppra 500mg  BID for now with plan to d/c if EEG is negative  - Continue frequent neuro checks and seizure precautions  -  Continuous telemetry and pulse oximetry  # AKI with Mild Rhabdomyolysis, Resolved # Mild Rhabdomyolysis  Creatinine has normalized. It is highly likely this acute elevation was pre-renal as patient appeared very dry on initial exam and endorsed decreased PO recently. May also have been in the setting of urinary retention given initial bladder scan of >360cc's with suprapubic pain; however, I expect this was due to post-ictal state / medications.  - Will d/c foley and attempt voiding trial  - Strict I&O  - Continue to encourage PO intake if bedside swallow remains appropriate  - Will hold off on further IVF for now  - Continue to monitor renal function daily while admitted   # Protein Malnutrition  Albumin was slightly low at 3.4. She does note that she has been eating and drinking less recently but does not specify for how long. - Encourage PO intake pending bedside swallow - PT/OT consulted - TOC consulted   # Hypertension, Improved  # Sinus Tachycardia, Resolved  # Subclinical Hyperthyroidism Patient's blood pressures have been better controlled today without antihypertensives PTA or currently. Her TSH is low at 0.264 although her free T4 is normal. Could be in the setting of withdrawal.  - T3 is pending  - Continue to monitor frequent vitals   # Hx of Polysubstance Use  Patient has had urine tox's return positive for amphetamines, opiates, benzos, and cannabinoids in the past although also has diagnoses of ADHD, chronic pain, and anxiety and has been prescribed Adderall and Norco in the past and Xanax more recently although denies history of marijuana use, IVDU or alcohol use. Her ETOH was <10 although per ED note, patient may have been drinking alcohol yesterday and there was concern her Xanax prescription had run out too soon. She does note taking extra Xanax for anxiety.  - Will reach out to [facility] for corroboration  - Continue CIWA and COWS monitoring  - Continue close  monitoring of vitals, telemetry, and pulse oximetry   # Intertrigo  Patient's bilateral groin rash (L > R) is most consistent with intertrigo (vs. Much less likely trauma).  - Start Nystatin powder BID   Code Status: Full Code  Diet: Regular, Thin Fluids IVF: None DVT PPx: Lovenox 40mg  daily  Prior to Admission Living Arrangement: Says she lived at home alone  Anticipated Discharge Location: ? Home, pending TOC evaluation (also for PCP) Barriers to Discharge: EEG / living resources Dispo: Anticipated discharge tomorrow.  [facility], MD 05/26/2020, 6:27 AM Pager: [phone] After 5pm on weekdays and 1pm on weekends: On Call pager [phone]
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
## 2020-05-26 NOTE — Discharge Instructions (Signed)
Seizure, Adult A seizure is a sudden burst of abnormal electrical and chemical activity in the brain. Seizures usually last from 30 seconds to 2 minutes.  What are the causes? Common causes of this condition include:  Fever or infection.  Problems that affect the brain. These may include: ? A brain or head injury. ? Bleeding in the brain. ? A brain tumor.  Low levels of blood sugar or salt.  Kidney problems or liver problems.  Conditions that are passed from parent to child (are inherited).  Problems with a substance, such as: ? Having a reaction to a drug or a medicine. ? Stopping the use of a substance all of a sudden (withdrawal).  A stroke.  Disorders that affect how you develop. Sometimes, the cause may not be known.  What increases the risk?  Having someone in your family who has epilepsy. In this condition, seizures happen again and again over time. They have no clear cause.  Having had a tonic-clonic seizure before. This type of seizure causes you to: ? Tighten the muscles of the whole body. ? Lose consciousness.  Having had a head injury or strokes before.  Having had a lack of oxygen at birth. What are the signs or symptoms? There are many types of seizures. The symptoms vary depending on the type of seizure you have. Symptoms during a seizure  Shaking that you cannot control (convulsions) with fast, jerky movements of muscles.  Stiffness of the body.  Breathing problems.  Feeling mixed up (confused).  Staring or not responding to sound or touch.  Head nodding.  Eyes that blink, flutter, or move fast.  Drooling, grunting, or making clicking sounds with your mouth  Losing control of when you pee or poop. Symptoms before a seizure  Feeling afraid, nervous, or worried.  Feeling like you may vomit.  Feeling like: ? You are moving when you are not. ? Things around you are moving when they are not.  Feeling like you saw or heard something before  (dj vu).  Odd tastes or smells.  Changes in how you see. You may see flashing lights or spots. Symptoms after a seizure  Feeling confused.  Feeling sleepy.  Headache.  Sore muscles. How is this treated? If your seizure stops on its own, you will not need treatment. If your seizure lasts longer than 5 minutes, you will normally need treatment. Treatment may include:  Medicines given through an IV tube.  Avoiding things, such as medicines, that are known to cause your seizures.  Medicines to prevent seizures.  A device to prevent or control seizures.  Surgery.  A diet low in carbohydrates and high in fat (ketogenic diet). Follow these instructions at home: Medicines  Take over-the-counter and prescription medicines only as told by your doctor.  Avoid foods or drinks that may keep your medicine from working, such as alcohol. Activity  Follow instructions about driving, swimming, or doing things that would be dangerous if you had another seizure. Wait until your doctor says it is safe for you to do these things.  If you live in the [place] ask your local department of motor vehicles when you can drive.  Get a lot of rest. Teaching others  Teach friends and family what to do when you have a seizure. They should: ? Help you get down to the ground. ? Protect your head and body. ? Loosen any clothing around your neck. ? Turn you on your side. ? Know whether or not   you need emergency care. ? Stay with you until you are better.  Also, tell them what not to do if you have a seizure. Tell them: ? They should not hold you down. ? They should not put anything in your mouth.   General instructions  Avoid anything that gives you seizures.  Keep a seizure diary. Write down: ? What you remember about each seizure. ? What you think caused each seizure.  Keep all follow-up visits. Contact a doctor if:  You have another seizure or seizures. Call the doctor each time you  have a seizure.  The pattern of your seizures changes.  You keep having seizures with treatment.  You have symptoms of being sick or having an infection.  You are not able to take your medicine. Get help right away if:  You have any of these problems: ? A seizure that lasts longer than 5 minutes. ? Many seizures in a row and you do not feel better between seizures. ? A seizure that makes it harder to breathe. ? A seizure and you can no longer speak or use part of your body.  You do not wake up right after a seizure.  You get hurt during a seizure.  You feel confused or have pain right after a seizure. These symptoms may be an emergency. Get help right away. Call your local emergency services (911 in the [place]  Do not wait to see if the symptoms will go away.  Do not drive yourself to the hospital. Summary  A seizure is a sudden burst of abnormal electrical and chemical activity in the brain. Seizures normally last from 30 seconds to 2 minutes.  Causes of seizures include illness, injury to the head, low levels of blood sugar or salt, and certain conditions.  Most seizures will stop on their own in less than 5 minutes. Seizures that last longer than 5 minutes are a medical emergency and need treatment right away.  Many medicines are used to treat seizures. Take over-the-counter and prescription medicines only as told by your doctor. This information is not intended to replace advice given to you by your health care provider. Make sure you discuss any questions you have with your health care provider. Document Revised: 07/05/2019 Document Reviewed: 07/05/2019 Elsevier Patient Education  2021 Elsevier Inc.  

## 2020-05-26 NOTE — TOC Initial Note (Signed)
Transition of Care [facility]) - Initial/Assessment Note    Patient Details  Name: Alexis Deleon MRN: [id number] Date of Birth: [birth year]  [facility]) CM/SW Contact:    Beckie Busing, RN Phone Number: [phone]  05/26/2020, 1:17 PM  Clinical Narrative:                 [facility] consulted for assessment of homelessness and substance abuse issues. CM at bedside with patient. Patient reports that she is not homeless. Patient states that she is currently living with her friend [name]. Patient refuses homeless resources stating that she is not homeless and that she will be staying with her friend at discharge. CM questioned patient about history of substance abuse. Patient states that she has a history of prescription pain medication abuse but that has been years ago and she does not have that issue anymore. Patient refuses CAGE AID and resources for substance abuse stating that was a long time ago. CM inquired if patient has a PCP. Patient states that she does have a PCP in [facility] but is unsure of the name of the physician. Patient states I take adderall so I have to go there frequently like every other month. TOC will continue to follow.  Expected Discharge Plan: Home/Self Care Barriers to Discharge: Continued Medical Work up   Patient Goals and CMS Choice Patient states their goals for this hospitalization and ongoing recovery are:: Ready to go home   Choice offered to / list presented to : NA  Expected Discharge Plan and Services Expected Discharge Plan: Home/Self Care In-house Referral: NA Discharge Planning Services: CM Consult Post Acute Care Choice: NA Living arrangements for the past 2 months: Single Family Home                 DME Arranged: N/A DME Agency: NA       HH Arranged: NA HH Agency: NA        Prior Living Arrangements/Services Living arrangements for the past 2 months: Single Family Home Lives with:: Friends Patient language and need for  interpreter reviewed:: Yes Do you feel safe going back to the place where you live?: Yes      Need for Family Participation in Patient Care: No (Comment) Care giver support system in place?: Yes (comment) Current home services:  (n/a) Criminal Activity/Legal Involvement Pertinent to Current Situation/Hospitalization: No - Comment as needed  Activities of Daily Living      Permission Sought/Granted   Permission granted to share information with : No              Emotional Assessment Appearance:: Appears stated age Attitude/Demeanor/Rapport: Gracious Affect (typically observed): Pleasant Orientation: : Oriented to Self,Oriented to Place,Oriented to  Time,Oriented to Situation Alcohol / Substance Use: Not Applicable Psych Involvement: No (comment)  Admission diagnosis:  Seizure (HCC) [R56.9] Encephalopathy [G93.40] Seizure-like activity (HCC) [R56.9] Patient Active Problem List   Diagnosis Date Noted  . Seizures (HCC) 05/25/2020  . Seizure-like activity (HCC) 05/25/2020  . Encephalopathy   . Seizure (HCC) 04/08/2018   PCP:  Patient, No Pcp Per (Inactive) Pharmacy:   [facility] STORE [phone] [place], [place] - [address] AT [facility] [address] [place] [address] Phone: [phone] Fax: [phone]     Social Determinants of Health (SDOH) Interventions    Readmission Risk Interventions Readmission Risk Prevention Plan 04/09/2018  Post Dischage Appt Complete  Medication Screening Complete  Transportation Screening Complete  Some recent data might  be hidden

## 2020-05-26 NOTE — Plan of Care (Signed)
  Problem: Clinical Measurements: Goal: Ability to maintain clinical measurements within normal limits will improve Outcome: Progressing   Problem: Nutrition: Goal: Adequate nutrition will be maintained Outcome: Progressing   Problem: Coping: Goal: Level of anxiety will decrease Outcome: Progressing   Problem: Pain Managment: Goal: General experience of comfort will improve Outcome: Progressing   Problem: Safety: Goal: Ability to remain free from injury will improve Outcome: Progressing   Problem: Skin Integrity: Goal: Risk for impaired skin integrity will decrease Outcome: Progressing   

## 2020-05-26 NOTE — Progress Notes (Signed)
Just spoke with [name], the person who this pt stated was supposed to pick her up, [name] stated that this pt does not live with her, and that she does not even know her. She pick the pt up from a truck stop, as she saw her laying there on the ground seizing and brung her into the emergency room. [name] stated that the pt shared with her that she  is homeless, has been evicted from her place as of 3 weeks ago. She has been visiting her here at the hospital, because she wanted to know how she was doing after being admitted.

## 2020-05-26 NOTE — Progress Notes (Signed)
At this time, Pt  Discharge is unsuccessful , pt states that she has a ride, [name] but does not know the phone number of the person picking her up. I help the pt to charge her cell phone, pt sated that it was broken and was not able to retrieve the phone number. Pt also stated that she does not know her address, so a bus pass/ cab option will not safice. MD R Speakman  Consult to CM in the morning.

## 2020-05-27 LAB — RPR: RPR Ser Ql: NONREACTIVE

## 2020-05-27 NOTE — Progress Notes (Signed)
Occupational Therapy Treatment Patient Details Name: Alexis Deleon MRN: [id number] DOB: [birth year] Today's Date: 05/27/2020    History of present illness 45 y.o. female presenting via EMS secondary to witnessed seizures. Patient admitted with seizure disorder vs. substance-induced seizure vs. medication / alcohol withdrawal seizure, AKI, and mild rhabdomyolysis. PMHx significant for T12 burst fx., lumbago, ADHD, Hx of polysubstance abuse, and anxiety/depression.   OT comments  Pt received supine in bed agreeable to OT intervention. Pt seems likely at baseline level of function completing grooming tasks at sink, functional mobility and toileting independently. Pt additionally completed higher level balance IADL task of changing sheets on her bed. Pt completed 5 step trail making task with with no errors and MIN cues ( for direction only as pt reports not being out of room since she's been admitted). Updated DC recs as indicated below as pt too high level for rehab needing only support for homelessness, will follow acutely per POC.   Follow Up Recommendations  Other (comment) (homeless shelter)    Equipment Recommendations  None recommended by OT    Recommendations for Other Services      Precautions / Restrictions Precautions Precautions: Fall Restrictions Weight Bearing Restrictions: No       Mobility Bed Mobility Overal bed mobility: Independent Bed Mobility: Supine to Sit           General bed mobility comments: from flat bed    Transfers Overall transfer level: Independent Equipment used: None Transfers: Sit to/from Stand Sit to Stand: Independent              Balance Overall balance assessment: Needs assistance Sitting-balance support: No upper extremity supported;Feet supported Sitting balance-Leahy Scale: Fair     Standing balance support: No upper extremity supported;During functional activity Standing balance-Leahy Scale: Good Standing balance  comment: standing at sink wtih no LOB                           ADL either performed or assessed with clinical judgement   ADL Overall ADL's : Needs assistance/impaired     Grooming: Brushing hair;Sitting;Independent;Wash/dry face Grooming Details (indicate cue type and reason): standing/ sitting in front of sink to brush her hair/ wash face         Upper Body Dressing : Independent;Sitting Upper Body Dressing Details (indicate cue type and reason): to don back side cover Lower Body Dressing: Independent Lower Body Dressing Details (indicate cue type and reason): to don socks Toilet Transfer: Independent;Ambulation;Regular Toilet   Toileting- Architect and Hygiene: Independent;Sit to/from stand       Functional mobility during ADLs: Independent General ADL Comments: pt completed cognitive trail making task, toileting, grooming, IADL ( making bed), and functional mobility with no LOB     Vision       Perception     Praxis      Cognition Arousal/Alertness: Awake/alert Behavior During Therapy: Flat affect Overall Cognitive Status: Impaired/Different from baseline Area of Impairment: Memory                     Memory: Decreased short-term memory         General Comments: noted to state different information in comparision to documentation, pt tells this OTA that she does not live with [name] that she is only her friend, pt able to complete 5 step pathfinding task with no errors and MIN a cues. Pt additionally able to assist with problem solving iADL  tasks such as changing sheets on bed. pt expresses anxiety about going to shelter        Exercises     Shoulder Instructions       General Comments      Pertinent Vitals/ Pain       Pain Assessment: Faces Faces Pain Scale: Hurts a little bit Pain Location: tongue Pain Descriptors / Indicators: Sore Pain Intervention(s): Monitored during session  Home Living                                           Prior Functioning/Environment              Frequency  Min 2X/week        Progress Toward Goals  OT Goals(current goals can now be found in the care plan section)  Progress towards OT goals: Progressing toward goals  Acute Rehab OT Goals Patient Stated Goal: "get out of here" OT Goal Formulation: With patient Time For Goal Achievement: 06/08/20 Potential to Achieve Goals: Good ADL Goals Pt Will Perform Lower Body Dressing: with modified independence;sit to/from stand Pt Will Transfer to Toilet: with modified independence;ambulating;regular height toilet Additional ADL Goal #1: pt will complete a written 5 step pathfinding task with less than 1 error ( pathfinding will demonstrate 2 and 3 step commands in the 5 step path) Additional ADL Goal #2: Pt will name 11 animals in 1 minute to demonstrate recall at a community level  Plan Discharge plan needs to be updated;Equipment recommendations need to be updated    Co-evaluation                 AM-PAC OT "6 Clicks" Daily Activity     Outcome Measure   Help from another person eating meals?: None Help from another person taking care of personal grooming?: None Help from another person toileting, which includes using toliet, bedpan, or urinal?: None Help from another person bathing (including washing, rinsing, drying)?: None Help from another person to put on and taking off regular upper body clothing?: None Help from another person to put on and taking off regular lower body clothing?: None 6 Click Score: 24    End of Session Equipment Utilized During Treatment: Gait belt  OT Visit Diagnosis: Unsteadiness on feet (R26.81);Muscle weakness (generalized) (M62.81);Other symptoms and signs involving cognitive function   Activity Tolerance Patient tolerated treatment well   Patient Left in bed;with call bell/phone within reach   Nurse Communication Mobility status        Time:  4818-5631 OT Time Calculation (min): 33 min  Charges: OT General Charges $OT Visit: 1 Visit OT Treatments $Self Care/Home Management : 23-37 mins  Lenor Derrick., COTA/L Acute Rehabilitation Services [phone]    Barron Schmid 05/27/2020, 3:37 PM

## 2020-05-27 NOTE — TOC Progression Note (Addendum)
Transition of Care [facility]) - Progression Note    Patient Details  Name: Alexis Deleon MRN: [id number] Date of Birth: 08/16/1975  Transition of Care [facility]) CM/SW Contact  Beckie Busing, RN Phone Number: [phone]  05/27/2020, 11:47 AM  Clinical Narrative:    CM at bedside to speak with patient about homeless issues. Patient states that she does not remember refusing homeless resources yesterday. CM offered patient immediate shelter at [facility]. Patient is agreeable. CM has called the shelter. Per shelter the patient must first have a phone interview with the intake coordinator and then an additional interview will take place once the patient arrives at the shelter. CM has left message for intake coordinator Tomma Lightning  [phone]. Will await return call. Patient has been updated.   1510 Cm called [facility]. Intake coordinator is available for initial phone interview. CM took phone to patients room. Patient interviewed. After conversation patient hangs up phone and tells CM that the facility will not accept her today because it is after 3pm and the facility does not accept patients after 3pm. [facility] and spoke with intake coordinator. Intake coordinator states that she did not tell the patient that the facility was unable to take her after 3pm.  Intake coordinator Ms. Annabell Howells states that patient said she has a car and she had to determine what she was going to do with her car and that she didn't have transportation to [facility]. The facility states that it would be best at this point (which it is now 3:30 pm) if the patient was transported to the facility in the morning. CM back at bedside to discuss the conversation with the patient. CM has made patient aware that it is the patients option for where she will go upon discharge but the patient is medically stable and is not able to continue to stay at the hospital indefinitely. Patient states that  she will go to [facility]. Patient has been made aware that CM will arrange transport to the facility in the am.   Expected Discharge Plan: Home/Self Care Barriers to Discharge: Continued Medical Work up  Expected Discharge Plan and Services Expected Discharge Plan: Home/Self Care In-house Referral: NA Discharge Planning Services: CM Consult Post Acute Care Choice: NA Living arrangements for the past 2 months: Single Family Home Expected Discharge Date: 05/26/20               DME Arranged: N/A DME Agency: NA       HH Arranged: NA HH Agency: NA         Social Determinants of Health (SDOH) Interventions    Readmission Risk Interventions Readmission Risk Prevention Plan 04/09/2018  Post Dischage Appt Complete  Medication Screening Complete  Transportation Screening Complete  Some recent data might be hidden

## 2020-05-27 NOTE — Progress Notes (Signed)
Pt removed her own IV w/o permission Site assessed, presents intact

## 2020-05-27 NOTE — Progress Notes (Signed)
Pt has decided that she will leave the hospital this evening. At this time she has a visitor , Lenise Arena said that she will allow this pt to come home with her for the night, and get her car. Pt then stated that she will drive to [facility]. Pt asked for the address. Address given D/C instructions explained Pt educated Pt acknowledged understanding.

## 2020-05-27 NOTE — Progress Notes (Signed)
Yesterday, Alexis Deleon told both IMTS and CM that she has a home and is currently living with her friend [name] with her dog. She refused homeless resources, intending to go back to [name] and refused [facility]AGE AID. However, after she was discharged yesterday, she informed RN that she does not have a working phone and didn't know [name] number to give her a call. She said she didn't know her address so she ended up staying in the hospital overnight.   This morning, she confided in [name] that she does not have a home to go back to. She says [name] was hopeful she would be able to apply for disability in the hospital. She was discharged yesterday and remains stable this morning. CM was notified and she was agreeable to go to [facility] today.   [name], MD 05/27/2020, 11:04 AM Pager: [phone]

## 2020-06-03 LAB — BENZODIAZEPINES,MS,WB/SP RFX
7-Aminoclonazepam: NEGATIVE ng/mL
Alprazolam: NEGATIVE ng/mL
Benzodiazepines Confirm: POSITIVE
Chlordiazepoxide: NEGATIVE
Clonazepam: NEGATIVE ng/mL
Desalkylflurazepam: NEGATIVE ng/mL
Desmethylchlordiazepoxide: NEGATIVE
Desmethyldiazepam: NEGATIVE ng/mL
Diazepam: NEGATIVE ng/mL
Flurazepam: NEGATIVE ng/mL
Lorazepam: NEGATIVE ng/mL
Midazolam: 3.2 ng/mL
Oxazepam: NEGATIVE ng/mL
Temazepam: NEGATIVE ng/mL
Triazolam: NEGATIVE ng/mL

## 2020-06-08 LAB — OPIATES,MS,WB/SP RFX
6-Acetylmorphine: NEGATIVE
Codeine: NEGATIVE ng/mL
Dihydrocodeine: NEGATIVE ng/mL
Hydrocodone: NEGATIVE ng/mL
Hydromorphone: NEGATIVE ng/mL
Morphine: NEGATIVE ng/mL
Opiate Confirmation: NEGATIVE

## 2020-06-15 LAB — THC,MS,WB/SP RFX
Cannabidiol: NEGATIVE ng/mL
Cannabinol: NEGATIVE ng/mL
Hydroxy-THC: NEGATIVE ng/mL
Tetrahydrocannabinol(THC): NEGATIVE ng/mL

## 2020-06-16 LAB — DRUG SCREEN 10 W/CONF, SERUM
Amphetamines, IA: NEGATIVE ng/mL
Barbiturates, IA: NEGATIVE ug/mL
Benzodiazepines, IA: POSITIVE ng/mL — AB
Cocaine & Metabolite, IA: NEGATIVE ng/mL
Methadone, IA: NEGATIVE ng/mL
Opiates, IA: NEGATIVE ng/mL
Oxycodones, IA: NEGATIVE ng/mL
Phencyclidine, IA: NEGATIVE ng/mL
Propoxyphene, IA: NEGATIVE ng/mL

## 2020-11-27 ENCOUNTER — Emergency Department
Admission: EM | Admit: 2020-11-27 | Discharge: 2020-12-04 | Disposition: A | Discharge status: Home / Self Care | Payer: Self-pay | Attending: Emergency Medicine | Admitting: Emergency Medicine

## 2020-11-27 ENCOUNTER — Other Ambulatory Visit: Payer: Self-pay

## 2020-11-27 DIAGNOSIS — S61512A Laceration without foreign body of left wrist, initial encounter: Secondary | ICD-10-CM | POA: Insufficient documentation

## 2020-11-27 DIAGNOSIS — R45851 Suicidal ideations: Secondary | ICD-10-CM

## 2020-11-27 DIAGNOSIS — U071 COVID-19: Secondary | ICD-10-CM | POA: Insufficient documentation

## 2020-11-27 DIAGNOSIS — Y908 Blood alcohol level of 240 mg/100 ml or more: Secondary | ICD-10-CM | POA: Insufficient documentation

## 2020-11-27 DIAGNOSIS — Z79899 Other long term (current) drug therapy: Secondary | ICD-10-CM | POA: Insufficient documentation

## 2020-11-27 DIAGNOSIS — F1010 Alcohol abuse, uncomplicated: Secondary | ICD-10-CM | POA: Insufficient documentation

## 2020-11-27 DIAGNOSIS — F32A Depression, unspecified: Secondary | ICD-10-CM

## 2020-11-27 DIAGNOSIS — F332 Major depressive disorder, recurrent severe without psychotic features: Secondary | ICD-10-CM | POA: Insufficient documentation

## 2020-11-27 DIAGNOSIS — Z23 Encounter for immunization: Secondary | ICD-10-CM | POA: Insufficient documentation

## 2020-11-27 DIAGNOSIS — X788XXA Intentional self-harm by other sharp object, initial encounter: Secondary | ICD-10-CM | POA: Insufficient documentation

## 2020-11-27 LAB — COMPREHENSIVE METABOLIC PANEL
ALT: 13 U/L (ref 0–44)
AST: 20 U/L (ref 15–41)
Albumin: 4 g/dL (ref 3.5–5.0)
Alkaline Phosphatase: 73 U/L (ref 38–126)
Anion gap: 9 (ref 5–15)
BUN: 14 mg/dL (ref 6–20)
CO2: 23 mmol/L (ref 22–32)
Calcium: 8.7 mg/dL — ABNORMAL LOW (ref 8.9–10.3)
Chloride: 108 mmol/L (ref 98–111)
Creatinine, Ser: 0.6 mg/dL (ref 0.44–1.00)
GFR, Estimated: >60 mL/min (ref >60)
Glucose, Bld: 92 mg/dL (ref 70–99)
Potassium: 3.3 mmol/L — ABNORMAL LOW (ref 3.5–5.1)
Sodium: 140 mmol/L (ref 135–145)
Total Bilirubin: 0.6 mg/dL (ref 0.3–1.2)
Total Protein: 6.7 g/dL (ref 6.5–8.1)

## 2020-11-27 LAB — CBC
HCT: 42 % (ref 36.0–46.0)
Hemoglobin: 14.2 g/dL (ref 12.0–15.0)
MCH: 29.8 pg (ref 26.0–34.0)
MCHC: 33.8 g/dL (ref 30.0–36.0)
MCV: 88.2 fL (ref 80.0–100.0)
Platelets: 316 10*3/uL (ref 150–400)
RBC: 4.76 MIL/uL (ref 3.87–5.11)
RDW: 12 % (ref 11.5–15.5)
WBC: 7.4 10*3/uL (ref 4.0–10.5)
nRBC: 0 % (ref 0.0–0.2)

## 2020-11-27 LAB — ETHANOL: Alcohol, Ethyl (B): 273 mg/dL — ABNORMAL HIGH (ref <10)

## 2020-11-27 LAB — SALICYLATE LEVEL: Salicylate Lvl: <7 mg/dL — ABNORMAL LOW (ref 7.0–30.0)

## 2020-11-27 LAB — RESP PANEL BY RT-PCR (FLU A&B, COVID) ARPGX2
Influenza A by PCR: NEGATIVE
Influenza B by PCR: NEGATIVE
SARS Coronavirus 2 by RT PCR: NEGATIVE

## 2020-11-27 LAB — ACETAMINOPHEN LEVEL: Acetaminophen (Tylenol), Serum: <10 ug/mL — ABNORMAL LOW (ref 10–30)

## 2020-11-27 MED ORDER — TETANUS-DIPHTH-ACELL PERTUSSIS 5-2.5-18.5 LF-MCG/0.5 IM SUSY
0.5000 mL | PREFILLED_SYRINGE | Freq: Once | INTRAMUSCULAR | Status: AC
  Administered 2020-11-27: 0.5 mL via INTRAMUSCULAR
  Filled 2020-11-27: qty 0.5

## 2020-11-27 NOTE — ED Notes (Signed)
Pt was given snack 

## 2020-11-27 NOTE — Discharge Instructions (Signed)
You have received sutures while in the emergency department of a left wrist laceration.  Please keep this area clean and covered with a bandage for the next 7 days.  Sutures will need to be removed in approximately 1 week.  Please follow-up with your primary care doctor, in urgent care or return to the emergency department for suture removal at that time.

## 2020-11-27 NOTE — BH Assessment (Signed)
Patient did not respond to her name when being called.  Rise and fall of chest noticed and snoring is heard.  She currently has a BAL of 273. Will try again when patient is able to participate in the assessment.

## 2020-11-27 NOTE — ED Triage Notes (Signed)
Patient to ED via EMS.  Per EMS patient has consumed unknown amount of alcohol and has a self infected would to left wrist area with bleeding controlled.

## 2020-11-27 NOTE — BH Assessment (Signed)
This writer attempted to assess pt, along side NP Rashaun D., however pt was unable to participate. Psych team to follow up when pt arouses.

## 2020-11-27 NOTE — ED Notes (Signed)
Patient reports over the past 3 weeks she has been evicted and she has had to surrender her dog, "so what use am I".

## 2020-11-27 NOTE — ED Provider Notes (Signed)
[facility] Emergency Department Provider Note  Time seen: 8:51 PM  I have reviewed the triage vital signs and the nursing notes.   HISTORY  Chief Complaint Laceration and Psychiatric Evaluation   HPI Alexis Deleon is a 45 y.o. female with a past medical history of anxiety, depression, chronic pain, presents to the emergency department for self-inflicted laceration.  According to the patient and report patient states she was recently evicted from her house, staying with a friend, had to give up her dog to the shelter became very depressed, has been drinking heavily including today and cut her left wrist with a box cutter in an attempt to hurt her self.  Patient denies any medical complaints.   Past Medical History:  Diagnosis Date   Anxiety    Bilateral forearm fractures    Chronic pain    Depression    History of pelvic fracture    Scoliosis     Patient Active Problem List   Diagnosis Date Noted   Seizures (HCC) 05/25/2020   Seizure-like activity (HCC) 05/25/2020   Encephalopathy    Seizure (HCC) 04/08/2018    No past surgical history on file.  Prior to Admission medications   Medication Sig Start Date End Date Taking? Authorizing Provider  cholecalciferol (VITAMIN D3) 10 MCG (400 UNIT) TABS tablet Take 2 tablets (800 Units total) by mouth daily. 05/27/20   [name], MD  nystatin (MYCOSTATIN/NYSTOP) powder Apply topically 2 (two) times daily. 05/26/20   [name], MD    Allergies  Allergen Reactions   Shellfish Allergy Anaphylaxis   Chlorhexidine     No family history on file.  Social History Social History   Tobacco Use   Smoking status: Never   Smokeless tobacco: Never  Vaping Use   Vaping Use: Never used  Substance Use Topics   Alcohol use: Yes   Drug use: Never    Review of Systems Constitutional: Negative for fever. Cardiovascular: Negative for chest pain. Respiratory: Negative for shortness of  breath. Gastrointestinal: Negative for abdominal pain, vomiting Musculoskeletal: Negative for musculoskeletal complaints Neurological: Negative for headache All other ROS negative  ____________________________________________   PHYSICAL EXAM:  VITAL SIGNS: ED Triage Vitals  Enc Vitals Group     BP 11/27/20 2048 (!) 140/100     Pulse Rate 11/27/20 2048 95     Resp 11/27/20 2048 16     Temp 11/27/20 2048 97.8 F (36.6 C)     Temp Source 11/27/20 2048 Oral     SpO2 11/27/20 2048 98 %     Weight 11/27/20 2049 200 lb (90.7 kg)     Height 11/27/20 2049 5\' 8"  (1.727 m)     Head Circumference --      Peak Flow --      Pain Score 11/27/20 2049 0     Pain Loc --      Pain Edu? --      Excl. in GC? --    Constitutional: Alert and oriented.  Slurred speech consistent alcohol intoxication.  Admits alcohol intoxication. Eyes: Normal exam ENT      Head: Normocephalic and atraumatic.      Mouth/Throat: Mucous membranes are moist. Cardiovascular: Normal rate, regular rhythm.  Respiratory: Normal respiratory effort without tachypnea nor retractions. Breath sounds are clear  Gastrointestinal: Soft and nontender. No distention. Musculoskeletal: Approximate 4 cm laceration to the distal left wrist, currently hemostatic Neurologic: Slurred speech.  No gross focal neurologic deficits Skin:  Skin is warm,  dry.  Laceration as described above. Psychiatric: Admits depression.  ____________________________________________    INITIAL IMPRESSION / ASSESSMENT AND PLAN / ED COURSE  Pertinent labs & imaging results that were available during my care of the patient were reviewed by me and considered in my medical decision making (see chart for details).   Patient presents emergency department for alcohol intoxication depression suicidal ideation self-inflicted laceration to left wrist.  Left wrist laceration approximately 4 cm in length.  Will need suture repair, hemostatic.  We will place the  patient under an IVC.  Repair the patient's wound.  Check labs and have psychiatry evaluate.  We will also update the patient's tetanus.  LACERATION REPAIR Performed by: Minna Antis Authorized by: Minna Antis Consent: Verbal consent obtained. Risks and benefits: risks, benefits and alternatives were discussed Consent given by: patient Patient identity confirmed: provided demographic data Prepped and Draped in normal sterile fashion Wound explored  Laceration Location: Left wrist  Laceration Length: 4 cm  No Foreign Bodies seen or palpated  Anesthesia: local infiltration  Local anesthetic: lidocaine 1% without epinephrine  Anesthetic total: 5 ml  Irrigation method: syringe Amount of cleaning: standard  Skin closure: 5-0 nylon  Number of sutures: 5  Technique: Simple interrupted  Patient tolerance: Patient tolerated the procedure well with no immediate complications.   Alexis Deleon was evaluated in Emergency Department on 11/27/2020 for the symptoms described in the history of present illness. She was evaluated in the context of the global COVID-19 pandemic, which necessitated consideration that the patient might be at risk for infection with the SARS-CoV-2 virus that causes COVID-19. Institutional protocols and algorithms that pertain to the evaluation of patients at risk for COVID-19 are in a state of rapid change based on information released by regulatory bodies including the CDC and federal and state organizations. These policies and algorithms were followed during the patient's care in the ED.  ____________________________________________   FINAL CLINICAL IMPRESSION(S) / ED DIAGNOSES  Self-inflicted laceration Depression   Minna Antis, MD 11/27/20 2054

## 2020-11-28 DIAGNOSIS — F332 Major depressive disorder, recurrent severe without psychotic features: Secondary | ICD-10-CM | POA: Diagnosis present

## 2020-11-28 LAB — RESP PANEL BY RT-PCR (FLU A&B, COVID) ARPGX2
Influenza A by PCR: NEGATIVE
Influenza B by PCR: NEGATIVE
SARS Coronavirus 2 by RT PCR: POSITIVE — AB

## 2020-11-28 LAB — URINE DRUG SCREEN, QUALITATIVE (ARMC ONLY)
Amphetamines, Ur Screen: NOT DETECTED
Barbiturates, Ur Screen: NOT DETECTED
Benzodiazepine, Ur Scrn: POSITIVE — AB
Cannabinoid 50 Ng, Ur ~~LOC~~: NOT DETECTED
Cocaine Metabolite,Ur ~~LOC~~: NOT DETECTED
MDMA (Ecstasy)Ur Screen: NOT DETECTED
Methadone Scn, Ur: NOT DETECTED
Opiate, Ur Screen: NOT DETECTED
Phencyclidine (PCP) Ur S: NOT DETECTED
Tricyclic, Ur Screen: NOT DETECTED

## 2020-11-28 MED ORDER — IBUPROFEN 600 MG PO TABS
600.0000 mg | ORAL_TABLET | Freq: Four times a day (QID) | ORAL | Status: DC | PRN
  Administered 2020-11-28 – 2020-12-04 (×11): 600 mg via ORAL
  Filled 2020-11-28 (×12): qty 1

## 2020-11-28 MED ORDER — DULOXETINE HCL 30 MG PO CPEP
30.0000 mg | ORAL_CAPSULE | Freq: Every day | ORAL | Status: DC
  Administered 2020-11-28 – 2020-12-04 (×7): 30 mg via ORAL
  Filled 2020-11-28 (×8): qty 1

## 2020-11-28 MED ORDER — GABAPENTIN 300 MG PO CAPS
300.0000 mg | ORAL_CAPSULE | Freq: Three times a day (TID) | ORAL | Status: DC
  Administered 2020-11-28: 300 mg via ORAL
  Filled 2020-11-28: qty 1

## 2020-11-28 MED ORDER — GABAPENTIN 300 MG PO CAPS
1200.0000 mg | ORAL_CAPSULE | Freq: Three times a day (TID) | ORAL | Status: DC
  Administered 2020-11-28 – 2020-12-04 (×17): 1200 mg via ORAL
  Filled 2020-11-28 (×17): qty 4

## 2020-11-28 MED ORDER — GABAPENTIN 300 MG PO CAPS: 1200.0000 mg | ORAL_CAPSULE | Freq: Three times a day (TID) | ORAL | Status: DC

## 2020-11-28 NOTE — BH Assessment (Addendum)
Comprehensive Clinical Assessment (CCA) Note  11/28/2020 Alexis Deleon [id number]  Chief Complaint:  Recommendations for Services/Supports/Treatments: Disposition pending psych consult.   Alexis Deleon is a 45 year old, English speaking, White female with a self-reported hx of depression. Pt presented to [facility] ED under IVC for ETOH intoxication, SI, and a self-inflicted larceration to her left wrist. On assessment, pt. admits to being sad due to having to turn her dog in to the shelter. Pt explained that her symptoms began about 2-3 days ago, reporting feelings of guilt and emotional anguish when she thinks of having to leave her dog. Pt reports, "My stomach hurts and my soul sinks when I think about it". Pt reported having a decrease in her appetite and sleep. Pt reported experiencing symptoms of hopelessness as she has no social supports outside of her dog. Pt also explained that she has a psychiatrist in [place] for medication management, but has not been med compliant. Pt reported that she has recently become homeless. The pt had good insight and dangerous judgement. Pt was cooperative throughout the assessment. Pt had slurred speech, and was visibly intoxicated. Pt had a disheveled appearance. Pt presented with a depressed mood; affect was congruent. Pt refused to confirm or deny current SI. Pt denied current HI/AV/H. Pt's BAL was 273; UDS was positive for benzos.   Visit Diagnosis: MDD    CCA Screening, Triage and Referral (STR)  Patient Reported Information How did you hear about [name]? -- (EMS)  Referral name: No data recorded Referral phone number: No data recorded  Whom do you see for routine medical problems? No data recorded Practice/Facility Name: No data recorded Practice/Facility Phone Number: No data recorded Name of Contact: No data recorded Contact Number: No data recorded Contact Fax Number: No data recorded Prescriber Name: No data recorded Prescriber Address  (if known): No data recorded  What Is the Reason for Your Visit/Call Today? SI; self inflicted laceration to left wrist.  How Long Has This Been Causing You Problems? <Week  What Do You Feel Would Help You the Most Today? Treatment for Depression or other mood problem   Have You Recently Been in Any Inpatient Treatment (Hospital/Detox/Crisis Center/28-Day Program)? No data recorded Name/Location of Program/Hospital:No data recorded How Long Were You There? No data recorded When Were You Discharged? No data recorded  Have You Ever Received Services From [facility] Before? No data recorded Who Do You See at [facility]? No data recorded  Have You Recently Had Any Thoughts About Hurting Yourself? Yes  Are You Planning to Commit Suicide/Harm Yourself At This time? -- (Pt refused to respond.)   Have you Recently Had Thoughts About [place]? No  Explanation: No data recorded  Have You Used Any Alcohol or Drugs in the Past 24 Hours? Yes  How Long Ago Did You Use Drugs or Alcohol? No data recorded What Did You Use and How Much? Pt reported having 3-4 shots of liquor prior to arrival.   Do You Currently Have a Therapist/Psychiatrist? Yes  Name of Therapist/Psychiatrist: Unable to recall   Have You Been Recently Discharged From Any Office Practice or Programs? No  Explanation of Discharge From Practice/Program: No data recorded    CCA Screening Triage Referral Assessment Type of Contact: Face-to-Face  Is this Initial or Reassessment? No data recorded Date Telepsych consult ordered in [facility]:  No data recorded Time Telepsych consult ordered in [facility]:  No data recorded  Patient Reported Information Reviewed? No data recorded Patient Left  Without Being Seen? No data recorded Reason for Not Completing Assessment: No data recorded  Collateral Involvement: None provided   Does Patient Have a Downs? No data recorded Name and Contact of Legal  Guardian: No data recorded If Minor and Not Living with Parent(s), Who has Custody? n/a  Is CPS involved or ever been involved? Never  Is APS involved or ever been involved? Never   Patient Determined To Be At Risk for Harm To Self or Others Based on Review of Patient Reported Information or Presenting Complaint? Yes, for Self-Harm  Method: No data recorded Availability of Means: No data recorded Intent: No data recorded Notification Required: No data recorded Additional Information for Danger to Others Potential: No data recorded Additional Comments for Danger to Others Potential: No data recorded Are There Guns or Other Weapons in Your Home? No data recorded Types of Guns/Weapons: No data recorded Are These Weapons Safely Secured?                            No data recorded Who Could Verify You Are Able To Have These Secured: No data recorded Do You Have any Outstanding Charges, Pending Court Dates, Parole/Probation? No data recorded Contacted To Inform of Risk of Harm To Self or Others: Other: Comment   Location of Assessment: [facility] ED   Does Patient Present under Involuntary Commitment? Yes  IVC Papers Initial File Date: 11/27/20   [place] of Residence: [place]   Patient Currently Receiving the Following Services: Medication Management   Determination of Need: Emergent (2 hours)   Options For Referral: Inpatient Hospitalization     CCA Biopsychosocial Intake/Chief Complaint:  No data recorded Current Symptoms/Problems: No data recorded  Patient Reported Schizophrenia/Schizoaffective Diagnosis in Past: No   Strengths: Pt is communicative and has good insight.  Preferences: No data recorded Abilities: No data recorded  Type of Services Patient Feels are Needed: No data recorded  Initial Clinical Notes/Concerns: No data recorded  Mental Health Symptoms Depression:   Tearfulness; Hopelessness; Increase/decrease in appetite   Duration of Depressive  symptoms:  Less than two weeks   Mania:   None   Anxiety:    Tension; Worrying   Psychosis:   None   Duration of Psychotic symptoms: No data recorded  Trauma:   Guilt/shame   Obsessions:   Cause anxiety; Good insight; Recurrent & persistent thoughts/impulses/images   Compulsions:   Intended to reduce stress or prevent another outcome; Good insight; "Driven" to perform behaviors/acts; Repeated behaviors/mental acts   Inattention:   None   Hyperactivity/Impulsivity:   None   Oppositional/Defiant Behaviors:   None   Emotional Irregularity:   Potentially harmful impulsivity; Recurrent suicidal behaviors/gestures/threats   Other Mood/Personality Symptoms:  No data recorded   Mental Status Exam Appearance and self-care  Stature:  No data recorded  Weight:   Average weight   Clothing:   -- (In scrubs)   Grooming:   Normal   Cosmetic use:   None   Posture/gait:   Normal   Motor activity:   Not Remarkable   Sensorium  Attention:   Normal   Concentration:   Normal   Orientation:   Situation; Place; Person; Object   Recall/memory:   Normal   Affect and Mood  Affect:   Depressed   Mood:   Depressed   Relating  Eye contact:   Avoided   Facial expression:   Sad   Attitude toward examiner:  Cooperative   Thought and Language  Speech flow:  Slurred   Thought content:   Appropriate to Mood and Circumstances   Preoccupation:   Guilt   Hallucinations:   None   Organization:  No data recorded  [employer] of Knowledge:   Average   Intelligence:   Average   Abstraction:   Normal   Judgement:   Dangerous   Reality Testing:   Adequate   Insight:   Present   Decision Making:   Impulsive   Social Functioning  Social Maturity:   Isolates   Social Judgement:   Normal   Stress  Stressors:   Grief/losses   Coping Ability:   Exhausted; Deficient supports   Skill Deficits:   Decision making    Supports:   Support needed     Religion: Religion/Spirituality Are You A Religious Person?:  (Not assessed.) How Might This Affect Treatment?: n/a  Leisure/Recreation: Leisure / Recreation Do You Have Hobbies?: No  Exercise/Diet: Exercise/Diet Do You Exercise?: No Have You Gained or Lost A Significant Amount of Weight in the Past Six Months?: No Do You Follow a Special Diet?: No Do You Have Any Trouble Sleeping?: No   CCA Employment/Education Employment/Work Situation: Employment / Work Situation Employment Situation: Unemployed Patient's Job has Been Impacted by Current Illness: No Has Patient ever Been in Equities trader?: No  Education: Education Is Patient Currently Attending School?: No Last Grade Completed:  (Not assessed) Did Theme park manager?: No Did You Have An Individualized Education Program (IIEP): No Did You Have Any Difficulty At School?: No Patient's Education Has Been Impacted by Current Illness: No   CCA Family/Childhood History Family and Relationship History: Family history Marital status: Single Does patient have children?: No  Childhood History:  Childhood History By whom was/is the patient raised?: Mother Did patient suffer any verbal/emotional/physical/sexual abuse as a child?: No Did patient suffer from severe childhood neglect?: No Has patient ever been sexually abused/assaulted/raped as an adolescent or adult?: No Was the patient ever a victim of a crime or a disaster?: No Witnessed domestic violence?: No Has patient been affected by domestic violence as an adult?: No  Child/Adolescent Assessment:     CCA Substance Use Alcohol/Drug Use: Alcohol / Drug Use Pain Medications: See MAR Prescriptions: See MAR Over the Counter: See MAR History of alcohol / drug use?: Yes Longest period of sobriety (when/how long): Unknown Negative Consequences of Use: Financial Withdrawal Symptoms: None                          ASAM's:  Six Dimensions of Multidimensional Assessment  Dimension 1:  Acute Intoxication and/or Withdrawal Potential:   Dimension 1:  Description of individual's past and current experiences of substance use and withdrawal: Pt presents will a BAL of 273 and is intoxicated  Dimension 2:  Biomedical Conditions and Complications:      Dimension 3:  Emotional, Behavioral, or Cognitive Conditions and Complications:     Dimension 4:  Readiness to Change:     Dimension 5:  Relapse, Continued use, or Continued Problem Potential:     Dimension 6:  Recovery/Living Environment:     ASAM Severity Score: ASAM's Severity Rating Score: 9  ASAM Recommended Level of Treatment: ASAM Recommended Level of Treatment: Level I Outpatient Treatment   Substance use Disorder (SUD) Substance Use Disorder (SUD)  Checklist Symptoms of Substance Use: Substance(s) often taken in larger amounts or over longer times than  was intended  Recommendations for Services/Supports/Treatments: Recommendations for Services/Supports/Treatments Recommendations For Services/Supports/Treatments: Individual Therapy  DSM5 Diagnoses: Patient Active Problem List   Diagnosis Date Noted   Seizures ([place]) 05/25/2020   Seizure-like activity ([place]) 05/25/2020   Encephalopathy    Seizure ([place]) 04/08/2018    Brittney Mucha R [place], LCAS

## 2020-11-28 NOTE — ED Notes (Signed)
Pt. Requested for something to drink. Pt was given ginger ale.
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
## 2020-11-28 NOTE — ED Notes (Signed)
Pt resting in bed, resps unlabored.

## 2020-11-28 NOTE — ED Provider Notes (Signed)
Emergency Medicine Observation Re-evaluation Note  Alexis Deleon is a 45 y.o. female, seen on rounds today.  Pt initially presented to the ED for complaints of Laceration and Psychiatric Evaluation Currently, the patient is resting comfortably and denies any acute complaints.  Physical Exam  BP (!) 145/91 (BP Location: Left Arm)   Pulse 99   Temp 98.7 F (37.1 C) (Oral)   Resp 20   Ht 5\' 8"  (1.727 m)   Wt 90.7 kg   LMP  (LMP Unknown)   SpO2 96%   BMI 30.41 kg/m  Physical Exam Gen: No acute distress  Resp: Normal rise and fall of chest Neuro: Moving all four extremities Psych: Resting currently, calm and cooperative when awake    ED Course / MDM  EKG:   I have reviewed the labs performed to date as well as medications administered while in observation.  Recent changes in the last 24 hours include no acute events overnight.  Plan  Current plan is for psychiatric evaluation for further disposition. Alexis Deleon is under involuntary commitment.      Octavion Mollenkopf, Jimmie Molly, DO 11/28/20 0900

## 2020-11-29 MED ORDER — ACETAMINOPHEN 500 MG PO TABS
1000.0000 mg | ORAL_TABLET | Freq: Once | ORAL | Status: AC
  Administered 2020-11-29: 1000 mg via ORAL
  Filled 2020-11-29: qty 2

## 2020-11-29 MED ORDER — ACETAMINOPHEN 500 MG PO TABS
1000.0000 mg | ORAL_TABLET | Freq: Three times a day (TID) | ORAL | Status: DC | PRN
  Administered 2020-11-29 – 2020-12-02 (×4): 1000 mg via ORAL
  Filled 2020-11-29 (×4): qty 2

## 2020-11-29 NOTE — ED Provider Notes (Signed)
Emergency Medicine Observation Re-evaluation Note  Alexis Deleon is a 45 y.o. female, seen on rounds today.  Pt initially presented to the ED for complaints of Laceration and Psychiatric Evaluation Currently, the patient is resting.  Physical Exam  BP 132/85 (BP Location: Left Arm)   Pulse 83   Temp 98.2 F (36.8 C) (Oral)   Resp 18   Ht 1.727 m (5\' 8" )   Wt 90.7 kg   LMP  (LMP Unknown)   SpO2 96%   BMI 30.41 kg/m  Physical Exam Gen:  No acute distress Resp:  Breathing easily and comfortably, no accessory muscle usage Neuro:  Moving all four extremities, no gross focal neuro deficits Psych:  Resting currently, calm when awake MSK: Patient reporting increased pain in her left wrist at this site that she lacerated herself and received sutures yesterday.  ED Course / MDM  EKG:   I have reviewed the labs performed to date as well as medications administered while in observation.  Recent changes in the last 24 hours include increased pain in the left wrist.  However, on exam, the wound is well-appearing and there is no surrounding erythema, fluctuance, nor induration.  The wound is well approximated and there is no evidence of dehiscence or discharge.  Compartments are soft and easily compressible.  Plan  Current plan is for psychiatric evaluation and disposition.  Patient is COVID-positive and will likely require period of clearance in the emergency department before being placed.  Charnise TAKINA BUSSER is under involuntary commitment.      Jimmie Molly, MD 11/29/20 [phone]

## 2020-11-29 NOTE — ED Notes (Signed)
PT complaint of headache,.

## 2020-11-29 NOTE — ED Notes (Signed)
VS assess. Shower offered. No other needs found at this moment.  ?

## 2020-11-29 NOTE — ED Notes (Signed)
IVC pending placement 

## 2020-11-29 NOTE — ED Notes (Signed)
Report to ashley, rn

## 2020-11-29 NOTE — ED Notes (Signed)
C/o mild headache, EDP informed. Pt given snack. Denies further needs.

## 2020-11-29 NOTE — ED Notes (Signed)
Pt states awoken from pain to left wrist, no swelling noted, no discoloration noted. Informed pt will ask MD if another pain med can be ordered.

## 2020-11-29 NOTE — ED Notes (Signed)
Breakfast tray given. °

## 2020-11-29 NOTE — ED Notes (Signed)
Pt uncle here and pt has given him permission to have updates about her chart and information as needed. He is her  family who helps care for her.  Alexis Deleon  [phone]

## 2020-11-29 NOTE — ED Notes (Signed)
Pt ice packs refilled.

## 2020-11-29 NOTE — ED Notes (Signed)
Lunch tray given. 

## 2020-11-29 NOTE — ED Notes (Signed)
Pt states her left wrist is throbbing where sutures are. No s/s of infection noted on inspection. Ibuprofen offered.

## 2020-11-30 NOTE — ED Provider Notes (Signed)
Emergency Medicine Observation Re-evaluation Note  Alexis Deleon is a 45 y.o. female, seen on rounds today.  Pt initially presented to the ED for complaints of Laceration and Psychiatric Evaluation   Physical Exam  BP (!) 147/96 (BP Location: Left Arm)   Pulse 80   Temp 99.1 F (37.3 C) (Oral)   Resp 16   Ht 5\' 8"  (1.727 m)   Wt 90.7 kg   LMP  (LMP Unknown)   SpO2 98%   BMI 30.40 kg/m  Physical Exam General: no acute distress Lungs: resps are even and unlabored Psych: no agitation  ED Course / MDM  EKG:   I have reviewed the labs performed to date as well as medications administered while in observation.  Recent changes in the last 24 hours include no change.  Plan  Current plan is for inpatient psychiatric treatment. Alexis Deleon is under involuntary commitment.      Jimmie Molly, MD 11/30/20 [phone]

## 2020-11-30 NOTE — ED Notes (Signed)
Pt given ice pack for H/A per her request.

## 2020-11-30 NOTE — ED Notes (Signed)
This RN gave pt the phone to call her mother.

## 2020-11-30 NOTE — TOC Initial Note (Signed)
Transition of Care [facility]) - Initial/Assessment Note    Patient Details  Name: Alexis Deleon MRN: [id number] Date of Birth: [birth year]  Transition of [facility]) CM/SW Contact:    Hetty Ely, RN Phone Number: 11/30/2020, 5:00 PM  Clinical Narrative:  TOCRN spoke with patient due to follow up phone call from uncle Twana First recommending that patient apply to go to [email or web address]. Patient was receptive to go. Patient is currently in [place] will have TOC to assist with the process tomorrow.                       Patient Goals and CMS Choice        Expected Discharge Plan and Services                                                Prior Living Arrangements/Services                       Activities of Daily Living      Permission Sought/Granted                  Emotional Assessment              Admission diagnosis:  lft arm laceration Patient Active Problem List   Diagnosis Date Noted   Major depressive disorder, recurrent severe without psychotic features (HCC) 11/28/2020   Seizures (HCC) 05/25/2020   Seizure-like activity (HCC) 05/25/2020   Encephalopathy    Seizure (HCC) 04/08/2018   PCP:  Zenaida Niece, MD Pharmacy:   [facility] [phone] [place], [place] - [address] AT [facility] [address] [place] [address] Phone: [phone] Fax: [phone]     Social Determinants of Health (SDOH) Interventions    Readmission Risk Interventions Readmission Risk Prevention Plan 04/09/2018  Post Dischage Appt Complete  Medication Screening Complete  Transportation Screening Complete  Some recent data might be hidden

## 2020-11-30 NOTE — ED Notes (Signed)
Pt requesting ibuprofen and gingerale at this time.

## 2020-11-30 NOTE — ED Notes (Signed)
Pt given breakfast tray at this time. 

## 2020-12-01 MED ORDER — HYDROXYZINE HCL 25 MG PO TABS
50.0000 mg | ORAL_TABLET | Freq: Once | ORAL | Status: AC
  Administered 2020-12-01: 50 mg via ORAL
  Filled 2020-12-01: qty 2

## 2020-12-01 MED ORDER — LORAZEPAM 1 MG PO TABS
1.0000 mg | ORAL_TABLET | Freq: Once | ORAL | Status: AC
  Administered 2020-12-01: 1 mg via ORAL
  Filled 2020-12-01: qty 1

## 2020-12-01 NOTE — ED Notes (Signed)
Hospital meal provided.  100% consumed, pt tolerated w/o complaints.  Waste discarded appropriately.   

## 2020-12-01 NOTE — ED Notes (Signed)
C/o pain to left arm where sutures are. Sutures intact. Skin around area [facility]

## 2020-12-01 NOTE — ED Notes (Signed)
Pt was given snack 

## 2020-12-01 NOTE — ED Notes (Signed)
IVC/Pending Placement 

## 2020-12-01 NOTE — ED Notes (Signed)
IVC/  PENDING  PLACEMENT 

## 2020-12-01 NOTE — ED Notes (Signed)
Pt given meal tray.

## 2020-12-01 NOTE — ED Notes (Signed)
Patient provided snack at appropriate snack time.  Pt consumed 100% of snack provided, tolerated well w/o complaints   Trash disposted of appropriately by patient.  

## 2020-12-01 NOTE — ED Notes (Signed)
Pt given lunch tray and a juice.

## 2020-12-01 NOTE — ED Provider Notes (Signed)
Emergency Medicine Observation Re-evaluation Note  Alexis Deleon is a 45 y.o. female, seen on rounds today.  Pt initially presented to the ED for complaints of Laceration and Psychiatric Evaluation   Physical Exam  BP (!) 148/96 (BP Location: Left Arm)   Pulse 98   Temp 98.6 F (37 C) (Oral)   Resp 17   Ht 5\' 8"  (1.727 m)   Wt 90.7 kg   LMP  (LMP Unknown)   SpO2 97%   BMI 30.40 kg/m  Physical Exam General: nad Cardiac: well perfused Lungs: unlabored Psych: no agitation  ED Course / MDM  EKG:   I have reviewed the labs performed to date as well as medications administered while in observation.  Recent changes in the last 24 hours include none.  Plan  Current plan is for psych management. Alexis Deleon is under involuntary commitment.      Jimmie Molly, MD 12/01/20 [phone]

## 2020-12-02 MED ORDER — HYDROXYZINE HCL 25 MG PO TABS
50.0000 mg | ORAL_TABLET | Freq: Three times a day (TID) | ORAL | Status: DC | PRN
  Administered 2020-12-02 – 2020-12-04 (×4): 50 mg via ORAL
  Filled 2020-12-02 (×4): qty 2

## 2020-12-02 NOTE — Consult Note (Signed)
[facility] Face-to-Face Psychiatry Consult   Reason for Consult:  follow up Referring Physician:  EDP Patient Identification: Alexis Deleon MRN:  [id number] Principal Diagnosis: Major depressive disorder, recurrent severe without psychotic features (HCC) Diagnosis:  Principal Problem:   Major depressive disorder, recurrent severe without psychotic features (HCC)   Total Time spent with patient: 45 minutes  Subjective:   Alexis Deleon is a 45 y.o. female patient admitted with self-harm in  suicide attempt in the context of several losses.   HPI:  See previous consult note of 11/28/2020.  Patient was seen face-to-face and re-evaluated today. This is day 5 of being COVID positive. She denies suicidal or homicidal ideations. Denies auditory or visual hallucinations. She is calm, cooperative, pleasant. She states that she is very hopeful to go to [employer]" as she is homeless; loves to work, and they have animals to care for outside. Patient states she hopes to help other people who are in situations that she was in. Patient is psych cleared at this time.    Past Psychiatric History: see previous  Risk to Self:   Risk to Others:   Prior Inpatient Therapy:   Prior Outpatient Therapy:    Past Medical History:  Past Medical History:  Diagnosis Date   Anxiety    Bilateral forearm fractures    Chronic pain    Depression    History of pelvic fracture    Scoliosis    No past surgical history on file. Family History: No family history on file. Family Psychiatric  History: see previous Social History:  Social History   Substance and Sexual Activity  Alcohol Use Yes     Social History   Substance and Sexual Activity  Drug Use Never    Social History   Socioeconomic History   Marital status: Single    Spouse name: Not on file   Number of children: Not on file   Years of education: Not on file   Highest education level: Not on file  Occupational History   Not on file   Tobacco Use   Smoking status: Never   Smokeless tobacco: Never  Vaping Use   Vaping Use: Never used  Substance and Sexual Activity   Alcohol use: Yes   Drug use: Never   Sexual activity: Not on file  Other Topics Concern   Not on file  Social History Narrative   Not on file   Social Determinants of Health   Financial Resource Strain: Not on file  Food Insecurity: Not on file  Transportation Needs: Not on file  Physical Activity: Not on file  Stress: Not on file  Social Connections: Not on file   Additional Social History:    Allergies:   Allergies  Allergen Reactions   Shellfish Allergy Anaphylaxis   Chlorhexidine     Labs: No results found for this or any previous visit (from the past 48 hour(s)).  Current Facility-Administered Medications  Medication Dose Route Frequency Provider Last Rate Last Admin   acetaminophen (TYLENOL) tablet 1,000 mg  1,000 mg Oral TID PRN Sharman Cheek, MD   1,000 mg at 12/01/20 1019   DULoxetine (CYMBALTA) DR capsule 30 mg  30 mg Oral Daily Charm Rings, NP   30 mg at 12/02/20 1013   gabapentin (NEURONTIN) capsule 1,200 mg  1,200 mg Oral Q8H Charm Rings, NP   1,200 mg at 12/02/20 4765   ibuprofen (ADVIL) tablet 600 mg  600 mg Oral Q6H PRN Nanine Means  Y, NP   600 mg at 12/02/20 1103   Current Outpatient Medications  Medication Sig Dispense Refill   ALPRAZolam (XANAX) 1 MG tablet Take 1 mg by mouth daily as needed.     amphetamine-dextroamphetamine (ADDERALL) 30 MG tablet Take 1 tablet by mouth 2 (two) times daily.     cholecalciferol (VITAMIN D3) 10 MCG (400 UNIT) TABS tablet Take 2 tablets (800 Units total) by mouth daily. 30 tablet 0   gabapentin (NEURONTIN) 400 MG capsule Take 1,200 mg by mouth 3 (three) times daily.      Musculoskeletal: Strength & Muscle Tone: within normal limits Gait & Station: normal Patient leans: N/A   Psychiatric Specialty Exam:  Presentation  General Appearance: Appropriate for  Environment Eye Contact:Good Speech:Clear and Coherent Speech Volume:Normal Handedness:No data recorded  Mood and Affect  Mood:Depressed (stable) Affect:Appropriate; Congruent  Thought Process  Thought Processes:Coherent Descriptions of Associations:Intact Orientation:Full (Time, Place and Person) Thought Content:Logical History of Schizophrenia/Schizoaffective disorder:No  Duration of Psychotic Symptoms:No data recorded Hallucinations:Hallucinations: None Ideas of Reference:None Suicidal Thoughts:Suicidal Thoughts: No Homicidal Thoughts:Homicidal Thoughts: No  Sensorium  Memory:Immediate Good; Recent Good; Remote Good Judgment:Fair Insight:Good  Executive Functions  Concentration:Good Attention Span:Good Recall:Good Fund of Knowledge:Good Language:Good  Psychomotor Activity  Psychomotor Activity:Psychomotor Activity: Normal  Assets  Assets:Communication Skills; Desire for Improvement; Talents/Skills; Resilience; Physical Health  Sleep  Sleep:Sleep: Good  Physical Exam: Physical Exam Vitals and nursing note reviewed.  HENT:     Head: Normocephalic.     Nose: No congestion or rhinorrhea.  Eyes:     General:        Right eye: No discharge.        Left eye: No discharge.  Pulmonary:     Effort: Pulmonary effort is normal.  Musculoskeletal:        General: Normal range of motion.     Cervical back: Normal range of motion.  Skin:    General: Skin is dry.  Neurological:     Mental Status: She is alert and oriented to person, place, and time.  Psychiatric:        Attention and Perception: Attention normal.        Mood and Affect: Mood is depressed (stable).        Speech: Speech normal.        Behavior: Behavior normal.        Thought Content: Thought content normal.        Cognition and Memory: Cognition normal.        Judgment: Judgment normal.   Review of Systems  Psychiatric/Behavioral:  Positive for depression (stable). Negative for  hallucinations, substance abuse and suicidal ideas. The patient is not nervous/anxious and does not have insomnia.   All other systems reviewed and are negative. Blood pressure (!) 129/94, pulse (!) 114, temperature 98.8 F (37.1 C), temperature source Oral, resp. rate 20, height 5\' 8"  (1.727 m), weight 90.7 kg, SpO2 99 %. Body mass index is 30.4 kg/m.  Treatment Plan Summary: Plan Patient is a 45 year old female who cut her wrist in the context of losing her car, job,housing, her mom in a nursing home. She has little support, but her uncle has some involvement. Patient does not need psychiatric hospitalization at this time band needs assistance with housing. Refer to TOC.   Disposition: No evidence of imminent risk to self or others at present.  Refer to [facility] for housing assistance.   [facility], NP 12/02/2020 2:12 PM

## 2020-12-02 NOTE — ED Notes (Signed)
Pt given dinner tray.

## 2020-12-02 NOTE — ED Notes (Signed)
Meal provided at this time

## 2020-12-02 NOTE — ED Notes (Signed)
Requesting Tylenol for mild body aches.

## 2020-12-02 NOTE — ED Provider Notes (Signed)
Emergency Medicine Observation Re-evaluation Note  Sharesa JOZEE HAMMER is a 45 y.o. female, seen on rounds today.  Pt initially presented to the ED for complaints of Laceration and Psychiatric Evaluation Currently, the patient is resting, voices no medical complaint.  Physical Exam  BP (!) 129/94 (BP Location: Left Arm)   Pulse (!) 114   Temp 98.8 F (37.1 C) (Oral)   Resp 20   Ht 5\' 8"  (1.727 m)   Wt 90.7 kg   LMP  (LMP Unknown)   SpO2 99%   BMI 30.40 kg/m  Physical Exam General: Resting in no acute distress Cardiac: No cyanosis Lungs: Equal rise and fall Psych: Not agitated  ED Course / MDM  EKG:   I have reviewed the labs performed to date as well as medications administered while in observation.  Recent changes in the last 24 hours include no events overnight.  Plan  Current plan is for psychiatric disposition which is delayed by patient's COVID-positive status on 11/28/2020. Kacelyn JANIEL DERHAMMER is under involuntary commitment.      Jimmie Molly, MD 12/02/20 [phone]

## 2020-12-02 NOTE — ED Notes (Signed)
Patient provided snack at appropriate snack time.  Pt consumed 100% of snack provided, tolerated well w/o complaints   Trash disposted of appropriately by patient.  

## 2020-12-02 NOTE — ED Notes (Signed)
Pt given meal tray.

## 2020-12-03 MED ORDER — LORAZEPAM 1 MG PO TABS
1.0000 mg | ORAL_TABLET | Freq: Once | ORAL | Status: AC
  Administered 2020-12-03: 1 mg via ORAL
  Filled 2020-12-03: qty 1

## 2020-12-03 NOTE — ED Notes (Signed)
Report received from [facility] Patient care assumed. Patient/RN introduction complete. Will continue to monitor.

## 2020-12-03 NOTE — ED Notes (Signed)
Pt given snack. 

## 2020-12-03 NOTE — ED Notes (Signed)
Pt provided with hygiene supplies for shower, and clean scrubs

## 2020-12-03 NOTE — ED Provider Notes (Signed)
Emergency Medicine Observation Re-evaluation Note  Alexis Deleon is a 45 y.o. female, seen on rounds today.  Pt initially presented to the ED for complaints of Laceration and Psychiatric Evaluation Currently, the patient is resting.  Physical Exam  BP (!) 141/91 (BP Location: Right Arm)   Pulse 91   Temp 98.6 F (37 C) (Oral)   Resp 20   Ht 5\' 8"  (1.727 m)   Wt 90.7 kg   LMP  (LMP Unknown)   SpO2 96%   BMI 30.40 kg/m  Physical Exam Gen: No acute distress  Resp: Normal rise and fall of chest Neuro: Moving all four extremities Psych: Resting currently, calm and cooperative when awake    ED Course / MDM  EKG:   I have reviewed the labs performed to date as well as medications administered while in observation.  Recent changes in the last 24 hours include no acute events overnight.  Plan  Current plan is for inpatient psychiatric treatment. Alexis Deleon is under involuntary commitment.      Tashaya Ancrum, Jimmie Molly, DO 12/03/20 [phone]

## 2020-12-03 NOTE — ED Notes (Signed)
IVC/Pending Placement 

## 2020-12-03 NOTE — ED Notes (Signed)
Pt requesting for stitches on L arm to be dressed due to pt getting wet during shower. EDP made aware, this RN dressed with petroleum gauze, and kerlex.

## 2020-12-03 NOTE — ED Notes (Signed)
Pt taken off of isolation precautions per EDP

## 2020-12-03 NOTE — Consult Note (Signed)
  Released IVC, as TOC stated cannot look for placement if patient is on IVC. Patient is aware and still wants assistance to find appropriate housing.

## 2020-12-04 NOTE — ED Notes (Signed)
Phone provided to patient to contact social worker this am

## 2020-12-04 NOTE — TOC Transition Note (Addendum)
Transition of Care [facility]) - CM/SW Discharge Note   Patient Details  Name: Alexis Deleon MRN: [id number] Date of Birth: 10/23/1975  [facility]) CM/SW Contact:  Marina Goodell Phone Number: [phone] 12/04/2020, 11:28 AM   Clinical Narrative:     Patient will discharge with friend Benay Spice, 12PM pick up.  Update:  Patient will d/c to [address], [employer].  Transportation arrange, will pick up patient at ED lobby, will contact patient directly to her cell phone when they arrive. EDP/ED Staff updated.     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

## 2020-12-04 NOTE — ED Provider Notes (Signed)
-----------------------------------------   5:44 AM on 12/04/2020 -----------------------------------------   Blood pressure (!) 143/93, pulse 77, temperature 98.4 F (36.9 C), temperature source Oral, resp. rate 18, height 5\' 8"  (1.727 m), weight 90.7 kg, SpO2 98 %.  The patient is calm and cooperative at this time.  There have been no acute events since the last update.  IVC was rescinded by psychiatry and patient cleared from a psychiatric standpoint.  Awaiting disposition plan from social work team.   , MD 12/04/20 [phone]

## 2020-12-04 NOTE — ED Notes (Signed)
Pt talking again on phone to CSW.

## 2020-12-04 NOTE — ED Provider Notes (Signed)
Cleared for discharge by psychiatry with plan to go to a friend's house or home shelter.   Gilles Chiquito, MD 12/04/20 1024

## 2020-12-04 NOTE — TOC Initial Note (Addendum)
Transition of Care [facility]) - Initial/Assessment Note    Patient Details  Name: Alexis Deleon MRN: [id number] Date of Birth: 1975-09-24  [facility]) CM/SW Contact:    Marina Goodell Phone Number: [phone] 12/04/2020, 9:53 AM  Clinical Narrative:                  Patient presented to [facility] due to active S/I.  Patient remained under TTS care for five days and TOC consult was given on 12/02/2020, patient no longer meets inpatient psychiatric criteria. Patient is currently COVID+.  CSW spoke with patient and explained what TOC is able to assist with in the ED. CSW contacted [employer] and representative stated they will not have availability until 12/27/2020.  CSW updated patient on the information given to me by [facility] about availability.  CSW stated the representative from [facility] suggested the patient go to a [employer] or a family/friend's home and fill out the application there, and then call regularly to find out about availability. CSW provided patient with [email or web address] and [facility] [phone], contact information and this information was also added to the AVS.  CSW spoke with patient about requesting assistance from family, friends or go to a homeless shelter. CSW spoke with patient's uncle Twana First, who insisted CSW keep patient in ED until 12/27/2020, so she can go to a facility.  CSW explained ED was an acute care center and the patient did not have a medical or psychiatric need to remain in the ED.  CSW stated the options available to patient, were a homeless shelter or staying with friend or family. CSW recommended Mr. Laural Benes allow patient to stay until there was availability at the facility.  Mr. Laural Benes refused stating he did not have room for the patient.  Mr. Laural Benes stated he would contact this CSW later after he spoke with the patient.  CSW spoke with the patient who requested her cell phone to contact a friend and if  the friend was unable to assist her she would go to a homeless shelter.     Update:  CSW received call from patient's uncle Twana First who asked if CSW could arrange transportation for patient to [facility], and stated "I'll tell her what she has to say."  CSW explained if his intention was to have the patient stated she was in psychiatric crisis, [facility] would contact the police and they would bring the patient to the ED and she would be assessed and if she was found not meet inpatient psychiatric criteria she would be in the same situation as she is now.  CSW recommended to Mr. Laural Benes if his concern was the lack of housing he could allow the patient to stay with him until she could find other arrangements.  Mr. Laural Benes stated he was not going to let the patient stay with him, and stated the hospital was being neglectful for not finding the patient a place to stay. CSW reminded Mr. Laural Benes the ED was an acute care facility and we could find transportation for the patient to go to a homeless patient, but the a bed was not guaranteed because the shelter would need to assess the patient before they accept the patient. Mr. Laural Benes stated he would contact his CSW after he spoke with his niece.    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   Patient Goals and CMS Choice  Expected Discharge Plan and Services                                                Prior Living Arrangements/Services                       Activities of Daily Living      Permission Sought/Granted                  Emotional Assessment              Admission diagnosis:  lft arm laceration Patient Active Problem List   Diagnosis Date Noted   Major depressive disorder, recurrent severe without psychotic features (HCC) 11/28/2020   Seizures (HCC) 05/25/2020   Seizure-like activity (HCC) 05/25/2020   Encephalopathy     Seizure (HCC) 04/08/2018   PCP:  Zenaida Niece, MD Pharmacy:   [facility] [phone] [place], [place] - [address] AT [facility] [address] [place] [address] Phone: [phone] Fax: [phone]     Social Determinants of Health (SDOH) Interventions    Readmission Risk Interventions Readmission Risk Prevention Plan 04/09/2018  Post Dischage Appt Complete  Medication Screening Complete  Transportation Screening Complete  Some recent data might be hidden

## 2020-12-04 NOTE — ED Notes (Addendum)
Pt belongings returned to patient because she will be leaving with a friend, Benay Spice, at 12pm for pickup.

## 2020-12-04 NOTE — ED Notes (Signed)
Pt calling friend on her own phone to arrange ride from ER.

## 2020-12-04 NOTE — ED Notes (Signed)
Pt given in hospital phone to speak to social worker.

## 2020-12-04 NOTE — ED Notes (Signed)
CSW arranged for taxi to pick up pt. Pt will discharge to lobby to wait for ride.

## 2020-12-04 NOTE — ED Notes (Signed)
No change in condition, will continue to monitor.  

## 2021-11-06 IMAGING — CT CT HEAD W/O CM
4 series · 17 of 47 positions shown, 19 images · non-contrast
Comparison: None.

CLINICAL DATA: Delirium, seizure

EXAM:
CT HEAD WITHOUT CONTRAST
TECHNIQUE: Contiguous axial images were obtained from the base of the skull
through the vertex without intravenous contrast.

[Series 4: head wo · axial · 0.45mm/px · z∈[+1208,+1338]mm · 7 of 36 slices shown, 9 images]
[im 5/36  brain]
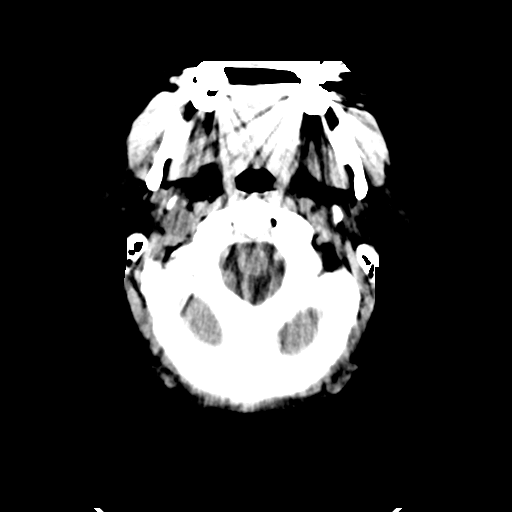
[im 5/36  bone]
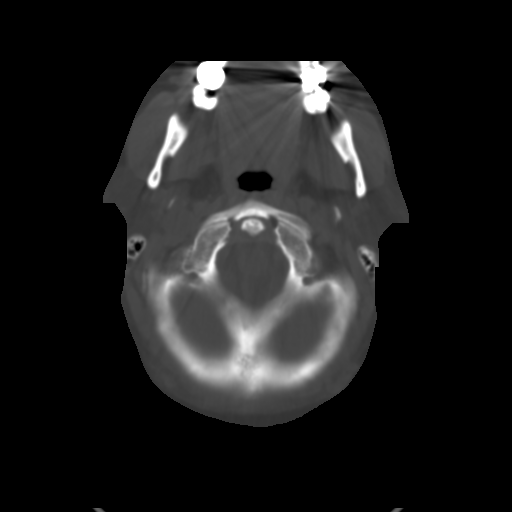
[im 9/36  brain]
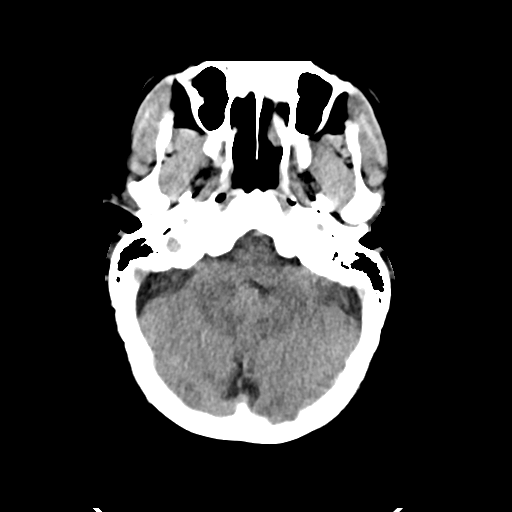
[im 14/36  brain]
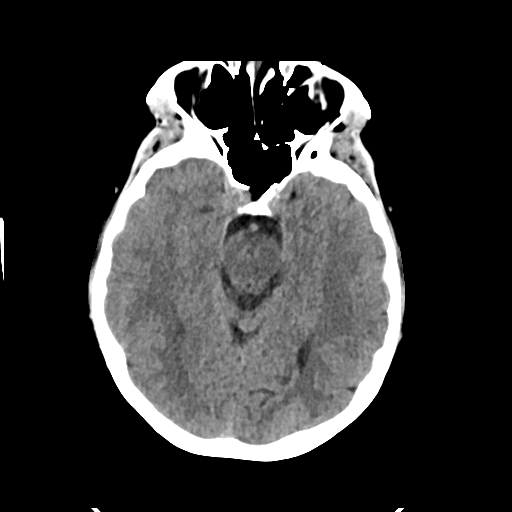
[im 18/36  brain]
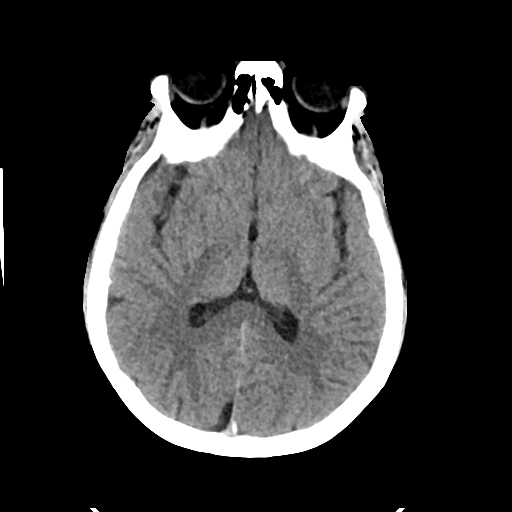
[im 22/36  brain]
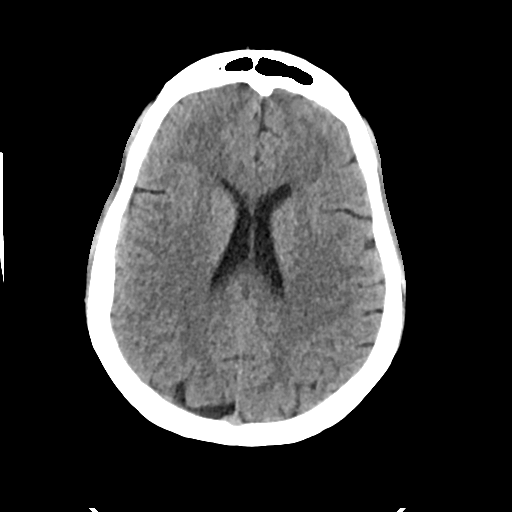
[im 22/36  bone]
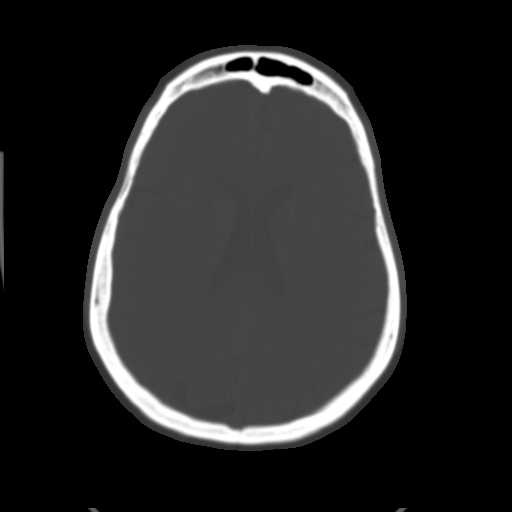
[im 27/36  brain]
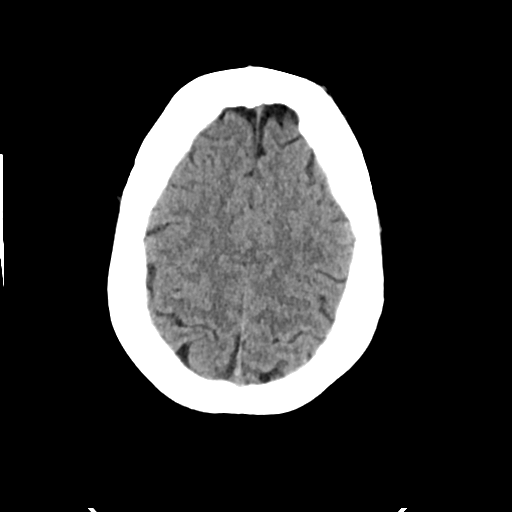
[im 31/36  brain]
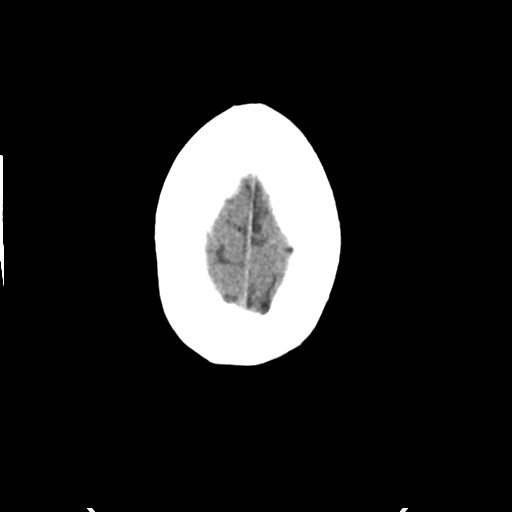

[Series 5: head bone · axial · 0.45mm/px · z∈[+1204,+1268]mm · 4 of 90 slices shown]
[im 9/90  bone]
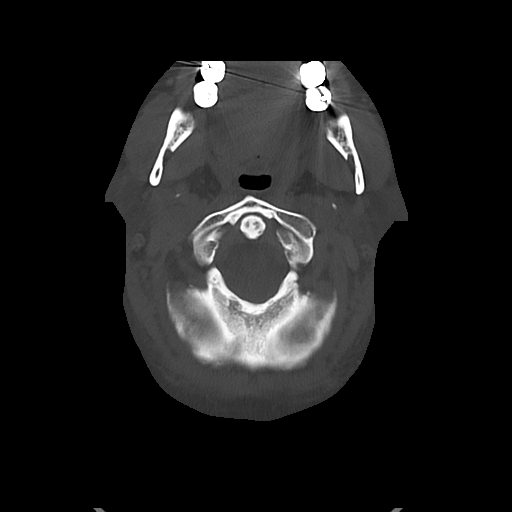
[im 18/90  bone]
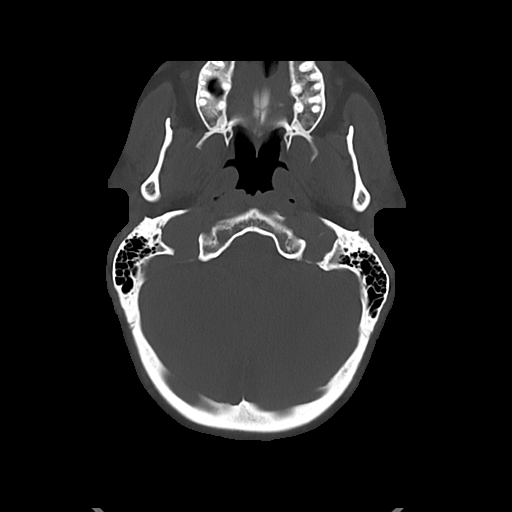
[im 27/90  bone]
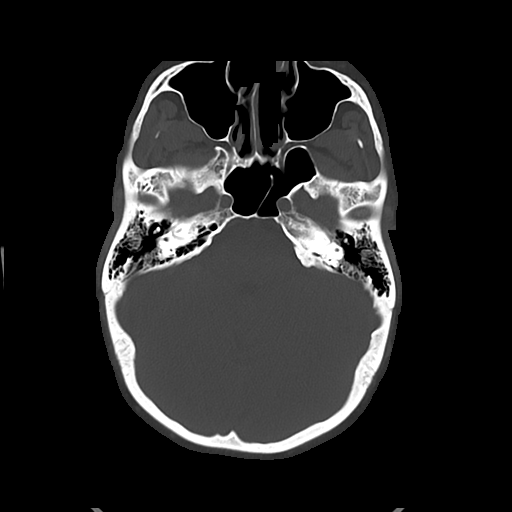
[im 41/90  bone]
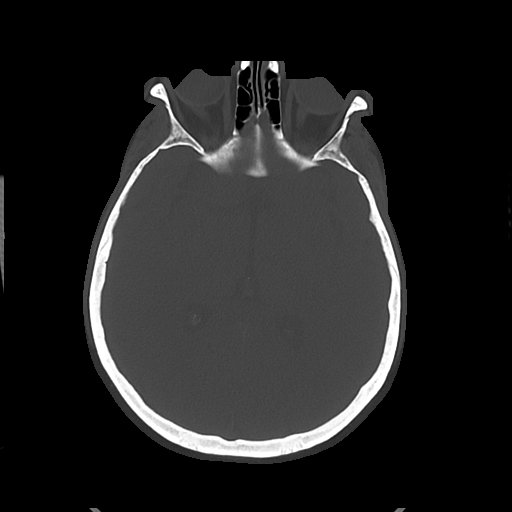

[Series 6: cor soft · coronal · 0.33mm/px · 3 of 73 slices shown]
[im 27/73  brain]
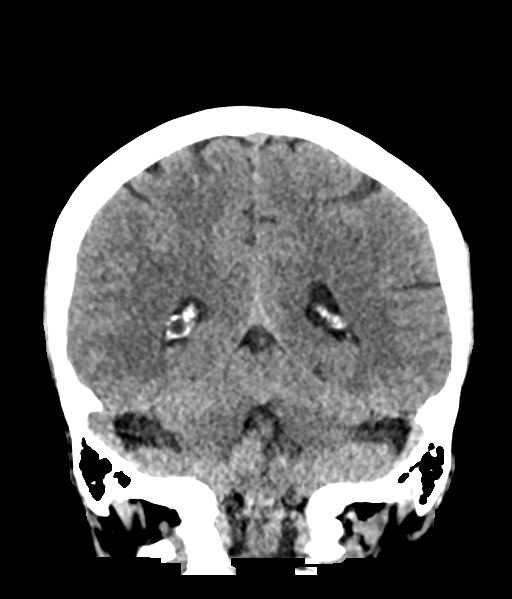
[im 33/73  brain]
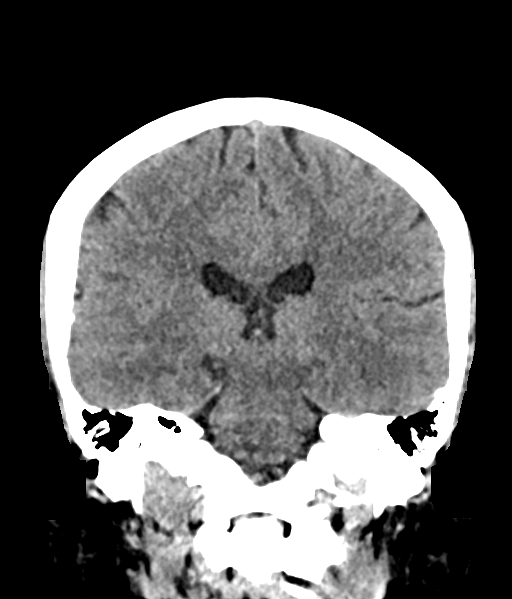
[im 40/73  brain]
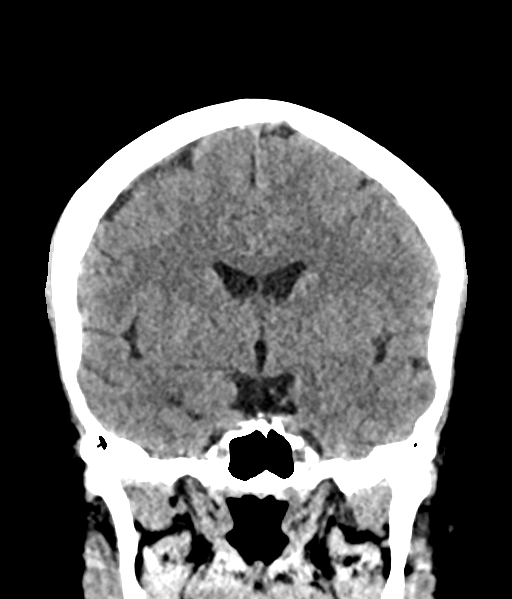

[Series 7: sag soft · sagittal · 0.35mm/px · 3 of 57 slices shown]
[im 19/57  brain]
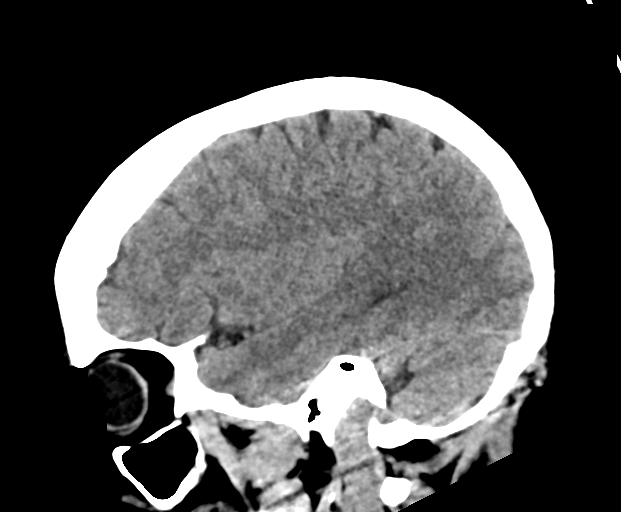
[im 29/57  brain]
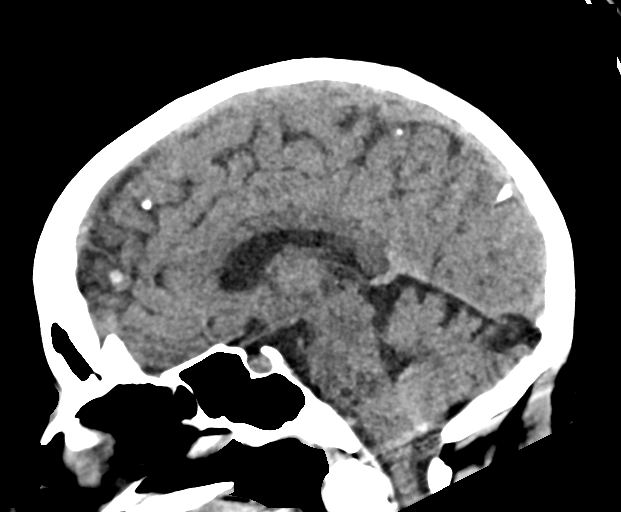
[im 38/57  brain]
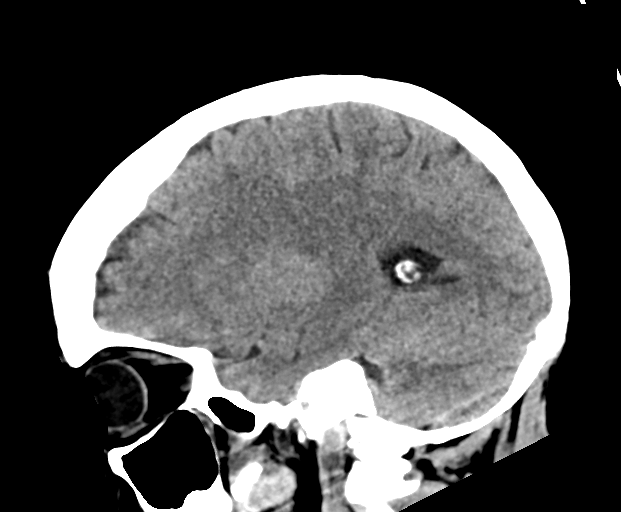

[17 of 47 positions shown; findings below may reference images not displayed]

FINDINGS: Brain: No evidence of acute infarction, hemorrhage, hydrocephalus,
extra-axial collection or mass lesion/mass effect.

Vascular: No hyperdense vessel or unexpected calcification.

Skull: Normal. Negative for fracture or focal lesion.

Sinuses/Orbits: No acute finding.

Other: None.
IMPRESSION: No acute intracranial pathology.
